# Patient Record
Sex: Male | Born: 1935 | Race: Black or African American | Hispanic: No | Marital: Married | Smoking: Former smoker
Health system: Southern US, Community
[De-identification: ages and names within clinical notes are randomized; demographics above are authoritative.]

## PROBLEM LIST (undated history)

## (undated) DIAGNOSIS — IMO0001 Reserved for inherently not codable concepts without codable children: Secondary | ICD-10-CM

## (undated) DIAGNOSIS — C61 Malignant neoplasm of prostate: Secondary | ICD-10-CM

## (undated) DIAGNOSIS — R002 Palpitations: Secondary | ICD-10-CM

## (undated) DIAGNOSIS — R55 Syncope and collapse: Secondary | ICD-10-CM

## (undated) DIAGNOSIS — I5189 Other ill-defined heart diseases: Secondary | ICD-10-CM

## (undated) DIAGNOSIS — J45909 Unspecified asthma, uncomplicated: Secondary | ICD-10-CM

## (undated) DIAGNOSIS — K118 Other diseases of salivary glands: Secondary | ICD-10-CM

## (undated) DIAGNOSIS — I779 Disorder of arteries and arterioles, unspecified: Secondary | ICD-10-CM

## (undated) DIAGNOSIS — R569 Unspecified convulsions: Secondary | ICD-10-CM

## (undated) DIAGNOSIS — I4510 Unspecified right bundle-branch block: Secondary | ICD-10-CM

## (undated) DIAGNOSIS — Z0389 Encounter for observation for other suspected diseases and conditions ruled out: Secondary | ICD-10-CM

## (undated) DIAGNOSIS — R42 Dizziness and giddiness: Secondary | ICD-10-CM

## (undated) DIAGNOSIS — I739 Peripheral vascular disease, unspecified: Secondary | ICD-10-CM

## (undated) DIAGNOSIS — I10 Essential (primary) hypertension: Secondary | ICD-10-CM

## (undated) HISTORY — DX: Other ill-defined heart diseases: I51.89

## (undated) HISTORY — DX: Palpitations: R00.2

## (undated) HISTORY — DX: Unspecified right bundle-branch block: I45.10

## (undated) HISTORY — PX: HERNIA REPAIR: SHX51

## (undated) HISTORY — DX: Peripheral vascular disease, unspecified: I73.9

## (undated) HISTORY — DX: Essential (primary) hypertension: I10

## (undated) HISTORY — DX: Disorder of arteries and arterioles, unspecified: I77.9

## (undated) HISTORY — DX: Other diseases of salivary glands: K11.8

## (undated) HISTORY — DX: Syncope and collapse: R55

## (undated) HISTORY — DX: Encounter for observation for other suspected diseases and conditions ruled out: Z03.89

## (undated) HISTORY — DX: Reserved for inherently not codable concepts without codable children: IMO0001

---

## 2003-08-10 ENCOUNTER — Other Ambulatory Visit: Payer: Self-pay

## 2006-03-06 ENCOUNTER — Other Ambulatory Visit: Payer: Self-pay

## 2006-03-06 ENCOUNTER — Emergency Department: Payer: Self-pay | Admitting: Emergency Medicine

## 2007-08-23 ENCOUNTER — Other Ambulatory Visit: Payer: Self-pay

## 2007-08-23 ENCOUNTER — Emergency Department: Payer: Self-pay | Admitting: Emergency Medicine

## 2009-08-29 ENCOUNTER — Emergency Department: Payer: Self-pay | Admitting: Internal Medicine

## 2013-03-13 ENCOUNTER — Emergency Department: Payer: Self-pay | Admitting: Emergency Medicine

## 2013-03-13 LAB — URINALYSIS, COMPLETE
Bacteria: NONE SEEN
Blood: NEGATIVE
Glucose,UR: NEGATIVE mg/dL (ref 0–75)
Ketone: NEGATIVE
Leukocyte Esterase: NEGATIVE
Nitrite: NEGATIVE
Squamous Epithelial: 1

## 2013-03-13 LAB — ETHANOL: Ethanol: 150 mg/dL

## 2013-03-13 LAB — COMPREHENSIVE METABOLIC PANEL
Albumin: 3.7 g/dL (ref 3.4–5.0)
Alkaline Phosphatase: 93 U/L
Bilirubin,Total: 0.4 mg/dL (ref 0.2–1.0)
Co2: 24 mmol/L (ref 21–32)
Creatinine: 1.1 mg/dL (ref 0.60–1.30)
Glucose: 65 mg/dL (ref 65–99)
Osmolality: 273 (ref 275–301)
Potassium: 3.6 mmol/L (ref 3.5–5.1)
SGOT(AST): 31 U/L (ref 15–37)

## 2013-03-13 LAB — CBC
HCT: 43.2 % (ref 40.0–52.0)
MCH: 29 pg (ref 26.0–34.0)
MCHC: 32.6 g/dL (ref 32.0–36.0)
MCV: 89 fL (ref 80–100)
Platelet: 176 10*3/uL (ref 150–440)
RDW: 14.9 % — ABNORMAL HIGH (ref 11.5–14.5)
WBC: 10.5 10*3/uL (ref 3.8–10.6)

## 2013-03-13 LAB — TSH: Thyroid Stimulating Horm: 2.53 u[IU]/mL

## 2013-03-14 LAB — TROPONIN I: Troponin-I: 0.04 ng/mL

## 2014-08-26 ENCOUNTER — Encounter (HOSPITAL_COMMUNITY): Payer: Self-pay

## 2014-08-26 ENCOUNTER — Other Ambulatory Visit: Payer: Self-pay | Admitting: Physician Assistant

## 2014-08-26 ENCOUNTER — Emergency Department (HOSPITAL_COMMUNITY): Payer: Medicare Other

## 2014-08-26 ENCOUNTER — Emergency Department (HOSPITAL_COMMUNITY)
Admission: EM | Admit: 2014-08-26 | Discharge: 2014-08-26 | Disposition: A | Discharge status: Home / Self Care | Payer: Medicare Other | Attending: Emergency Medicine | Admitting: Emergency Medicine

## 2014-08-26 DIAGNOSIS — I10 Essential (primary) hypertension: Secondary | ICD-10-CM | POA: Insufficient documentation

## 2014-08-26 DIAGNOSIS — R0789 Other chest pain: Principal | ICD-10-CM

## 2014-08-26 DIAGNOSIS — R001 Bradycardia, unspecified: Secondary | ICD-10-CM | POA: Diagnosis not present

## 2014-08-26 DIAGNOSIS — R42 Dizziness and giddiness: Secondary | ICD-10-CM | POA: Insufficient documentation

## 2014-08-26 DIAGNOSIS — R002 Palpitations: Secondary | ICD-10-CM

## 2014-08-26 DIAGNOSIS — R0602 Shortness of breath: Secondary | ICD-10-CM | POA: Insufficient documentation

## 2014-08-26 DIAGNOSIS — I509 Heart failure, unspecified: Secondary | ICD-10-CM | POA: Diagnosis not present

## 2014-08-26 DIAGNOSIS — Z79899 Other long term (current) drug therapy: Secondary | ICD-10-CM | POA: Insufficient documentation

## 2014-08-26 DIAGNOSIS — Z8546 Personal history of malignant neoplasm of prostate: Secondary | ICD-10-CM | POA: Diagnosis not present

## 2014-08-26 DIAGNOSIS — R079 Chest pain, unspecified: Secondary | ICD-10-CM | POA: Diagnosis present

## 2014-08-26 DIAGNOSIS — R5383 Other fatigue: Secondary | ICD-10-CM | POA: Diagnosis present

## 2014-08-26 DIAGNOSIS — R05 Cough: Secondary | ICD-10-CM | POA: Insufficient documentation

## 2014-08-26 DIAGNOSIS — Z87891 Personal history of nicotine dependence: Secondary | ICD-10-CM | POA: Insufficient documentation

## 2014-08-26 HISTORY — DX: Dizziness and giddiness: R42

## 2014-08-26 HISTORY — DX: Malignant neoplasm of prostate: C61

## 2014-08-26 LAB — COMPREHENSIVE METABOLIC PANEL
ALBUMIN: 3.8 g/dL (ref 3.5–5.0)
ALK PHOS: 73 U/L (ref 38–126)
ALT: 12 U/L — ABNORMAL LOW (ref 17–63)
ANION GAP: 11 (ref 5–15)
AST: 19 U/L (ref 15–41)
BUN: 16 mg/dL (ref 6–20)
CO2: 24 mmol/L (ref 22–32)
CREATININE: 1.2 mg/dL (ref 0.61–1.24)
Calcium: 9.6 mg/dL (ref 8.9–10.3)
Chloride: 104 mmol/L (ref 101–111)
GFR calc Af Amer: 60 mL/min — ABNORMAL LOW (ref >60)
GFR, EST NON AFRICAN AMERICAN: 52 mL/min — AB (ref >60)
GLUCOSE: 109 mg/dL — AB (ref 65–99)
Potassium: 3.8 mmol/L (ref 3.5–5.1)
Sodium: 139 mmol/L (ref 135–145)
Total Bilirubin: 0.7 mg/dL (ref 0.3–1.2)
Total Protein: 7.3 g/dL (ref 6.5–8.1)

## 2014-08-26 LAB — CBC WITH DIFFERENTIAL/PLATELET
Basophils Absolute: 0 10*3/uL (ref 0.0–0.1)
Basophils Relative: 1 % (ref 0–1)
Eosinophils Absolute: 0.1 10*3/uL (ref 0.0–0.7)
Eosinophils Relative: 2 % (ref 0–5)
HCT: 41.3 % (ref 39.0–52.0)
HEMOGLOBIN: 13.4 g/dL (ref 13.0–17.0)
LYMPHS ABS: 1.6 10*3/uL (ref 0.7–4.0)
LYMPHS PCT: 29 % (ref 12–46)
MCH: 27.8 pg (ref 26.0–34.0)
MCHC: 32.4 g/dL (ref 30.0–36.0)
MCV: 85.7 fL (ref 78.0–100.0)
MONOS PCT: 9 % (ref 3–12)
Monocytes Absolute: 0.5 10*3/uL (ref 0.1–1.0)
NEUTROS PCT: 59 % (ref 43–77)
Neutro Abs: 3.3 10*3/uL (ref 1.7–7.7)
Platelets: 198 10*3/uL (ref 150–400)
RBC: 4.82 MIL/uL (ref 4.22–5.81)
RDW: 14.6 % (ref 11.5–15.5)
WBC: 5.6 10*3/uL (ref 4.0–10.5)

## 2014-08-26 LAB — TROPONIN I
Troponin I: <0.03 ng/mL (ref <0.031)
Troponin I: <0.03 ng/mL (ref <0.031)

## 2014-08-26 LAB — I-STAT TROPONIN, ED: TROPONIN I, POC: 0.01 ng/mL (ref 0.00–0.08)

## 2014-08-26 LAB — BRAIN NATRIURETIC PEPTIDE: B Natriuretic Peptide: 56 pg/mL (ref 0.0–100.0)

## 2014-08-26 MED ORDER — LOSARTAN POTASSIUM 25 MG PO TABS: 25.0000 mg | ORAL_TABLET | Freq: Every day | ORAL | Status: DC

## 2014-08-26 MED ORDER — ASPIRIN 81 MG PO CHEW
324.0000 mg | CHEWABLE_TABLET | Freq: Once | ORAL | Status: AC
  Administered 2014-08-26: 324 mg via ORAL
  Filled 2014-08-26: qty 4

## 2014-08-26 NOTE — ED Notes (Signed)
Pt states he has been having chest pain for over a month off and on. Always has a cough but has been more steady lately. Feels tight across his chest. Reports a little SOB and dizziness.

## 2014-08-26 NOTE — Discharge Instructions (Signed)
Please call your doctor for a followup appointment within 24-48 hours. When you talk to your doctor please let them know that you were seen in the emergency department and have them acquire all of your records so that they can discuss the findings with you and formulate a treatment plan to fully care for your new and ongoing problems. Please follow-up with cardiology Please discontinue verapamil as recommended from cardiology Please start new medication that cardiology recommended Please rest and stay hydrated Please continue to monitor symptoms closely and if symptoms are to worsen or change (fever greater than 101, chills, sweating, nausea, vomiting, chest pain, shortness of breathe, difficulty breathing, weakness, numbness, tingling, worsening or changes to pain pattern, dizziness, blurred vision, sudden loss of vision, headache, dizziness, arm pain, squeezing sensation, difficulty breathing with walking, leg swelling, cough, coughing up blood) please report back to the Emergency Department immediately.   Palpitations A palpitation is the feeling that your heartbeat is irregular or is faster than normal. It may feel like your heart is fluttering or skipping a beat. Palpitations are usually not a serious problem. However, in some cases, you may need further medical evaluation. CAUSES  Palpitations can be caused by:  Smoking.  Caffeine or other stimulants, such as diet pills or energy drinks.  Alcohol.  Stress and anxiety.  Strenuous physical activity.  Fatigue.  Certain medicines.  Heart disease, especially if you have a history of irregular heart rhythms (arrhythmias), such as atrial fibrillation, atrial flutter, or supraventricular tachycardia.  An improperly working pacemaker or defibrillator. DIAGNOSIS  To find the cause of your palpitations, your health care provider will take your medical history and perform a physical exam. Your health care provider may also have you take a  test called an ambulatory electrocardiogram (ECG). An ECG records your heartbeat patterns over a 24-hour period. You may also have other tests, such as:  Transthoracic echocardiogram (TTE). During echocardiography, sound waves are used to evaluate how blood flows through your heart.  Transesophageal echocardiogram (TEE).  Cardiac monitoring. This allows your health care provider to monitor your heart rate and rhythm in real time.  Holter monitor. This is a portable device that records your heartbeat and can help diagnose heart arrhythmias. It allows your health care provider to track your heart activity for several days, if needed.  Stress tests by exercise or by giving medicine that makes the heart beat faster. TREATMENT  Treatment of palpitations depends on the cause of your symptoms and can vary greatly. Most cases of palpitations do not require any treatment other than time, relaxation, and monitoring your symptoms. Other causes, such as atrial fibrillation, atrial flutter, or supraventricular tachycardia, usually require further treatment. HOME CARE INSTRUCTIONS   Avoid:  Caffeinated coffee, tea, soft drinks, diet pills, and energy drinks.  Chocolate.  Alcohol.  Stop smoking if you smoke.  Reduce your stress and anxiety. Things that can help you relax include:  A method of controlling things in your body, such as your heartbeats, with your mind (biofeedback).  Yoga.  Meditation.  Physical activity such as swimming, jogging, or walking.  Get plenty of rest and sleep. SEEK MEDICAL CARE IF:   You continue to have a fast or irregular heartbeat beyond 24 hours.  Your palpitations occur more often. SEEK IMMEDIATE MEDICAL CARE IF:  You have chest pain or shortness of breath.  You have a severe headache.  You feel dizzy or you faint. MAKE SURE YOU:  Understand these instructions.  Will watch your  condition.  Will get help right away if you are not doing well or get  worse. Document Released: 03/11/2000 Document Revised: 03/19/2013 Document Reviewed: 05/13/2011 [facility] Patient Information 2015 [place], [place]. This information is not intended to replace advice given to you by your health care provider. Make sure you discuss any questions you have with your health care provider.   Emergency Department Resource Guide 1) Find a Doctor and Pay Out of Pocket Although you won't have to find out who is covered by your insurance plan, it is a good idea to ask around and get recommendations. You will then need to call the office and see if the doctor you have chosen will accept you as a new patient and what types of options they offer for patients who are self-pay. Some doctors offer discounts or will set up payment plans for their patients who do not have insurance, but you will need to ask so you aren't surprised when you get to your appointment.  2) Contact Your Local Health Department Not all health departments have doctors that can see patients for sick visits, but many do, so it is worth a call to see if yours does. If you don't know where your local health department is, you can check in your phone book. The CDC also has a tool to help you locate your state's health department, and many state websites also have listings of all of their local health departments.  3) Find a [facility] If your illness is not likely to be very severe or complicated, you may want to try a walk in clinic. These are popping up all over the country in pharmacies, drugstores, and shopping centers. They're usually staffed by nurse practitioners or physician assistants that have been trained to treat common illnesses and complaints. They're usually fairly quick and inexpensive. However, if you have serious medical issues or chronic medical problems, these are probably not your best option.  No Primary Care Doctor: - Call Health Connect at  [phone] - they can help you locate a primary  care doctor that  accepts your insurance, provides certain services, etc. - Physician Referral Service- [phone]  Chronic Pain Problems: Organization         Address  Phone   Notes  [facility]  [phone] Patients need to be referred by their primary care doctor.   Medication Assistance: Organization         Address  Phone   Notes  [facility] Medication [facility] [address] [place], [place] [address] [phone] --Must be a resident of [facility] -- Must have NO insurance coverage whatsoever (no Medicaid/ Medicare, etc.) -- The pt. MUST have a primary care doctor that directs their care regularly and follows them in the community   MedAssist  [phone]   [employer]  [phone]    Agencies that provide inexpensive medical care: Organization         Address  Phone   Notes  [place]  [phone]   [facility]    [phone]   [facility] [place], [place] [address] [phone]   [place] [address], [place] [phone]   Planned Parenthood    [phone]   [facility]    [phone]   [place] and [place], [employer] Phone:  [phone], Fax:  [phone] Hours of Operation:  9 am - 6 pm, M-F.  Also accepts Medicaid/Medicare and self-pay.  [facility] for [place], [address], [place] Phone: [phone], Fax: [phone]. Hours of Operation:  8:30 am - 5:30 pm, M-F.  Also accepts Medicaid and self-pay.  [facility] [place] [address] [place] Phone: [phone]   [place], [place], [place] [phone], Ext. [phone] Mondays & Thursdays: 7-9 AM.  First 15 patients are seen on a first come, first serve basis.    [place]  Providers:  Organization         Address  Phone   Notes  [facility] [address], [place] [phone] Also accepts self-pay patients.  [facility] [address], [place]  [phone]   [place], [address], [place] [phone]   [facility] [address], [place] [phone]   Lucianne Lei [address], [place]   [phone] Only accepts [place] Access [place] patients after they have their name applied to their card.   Self-Pay (no insurance) in [facility]:  Organization         Address  Phone   Notes  Sickle Cell Patients, [facility] [place] [phone]   [facility] [place] [phone]   [facility] [place]  [place], [place], [place] [phone]   [facility]/Dr. Osei-Bonsu  [address] [place] or [address], [place] [phone] Phone number for both [place] and [place] locations is the same.  [facility] [address], [place] [phone]   [facility] [address] [place] or [address] [phone]   [facility] [address] [place] [phone], phone; [phone], fax Sees patients 1st and 3rd Saturday of every month.  Must not qualify for public or private insurance (i.e. Medicaid, Medicare, [place] Health Choice, Veterans' Benefits)  Household income should be no more than 200% of the poverty level The clinic cannot treat you if you are pregnant or think you are pregnant  Sexually transmitted diseases are not treated at the clinic.    Dental Care: Organization         Address  Phone  Notes  [facility] Department of [facility] [place] [phone] Accepts children up to age 2 who are enrolled in [place] or [place]; pregnant women with a Medicaid card; and children who have applied for Medicaid or [place] Health Choice, but were declined, whose parents can pay a reduced fee at time of service.  [facility] Department of [facility]  [address], [place] [phone] Accepts children up to age 72 who are enrolled in [place] or [place]; pregnant women with a Medicaid card; and children who have applied for Medicaid or [place] Health Choice, but were declined, whose parents can pay a reduced fee at time of service.  [facility] Adult Dental Access PROGRAM  [place] [phone] Patients are seen by appointment only. Walk-ins  are not accepted. [place] will see patients 39 years of age and older. Monday - Tuesday (8am-5pm) Most Wednesdays (8:30-5pm) $30 per visit, cash only  [facility] Adult Dental Access PROGRAM  [address], [facility] [phone] Patients are seen by appointment only. Walk-ins are not accepted. [place] [facility] will see patients 56 years of age and older. One Wednesday Evening (Monthly: Volunteer Based).  $30 per visit, cash only  [place]  [phone] for adults; Children under age 3, call Graduate Pediatric Dentistry at [phone]. Children aged 85-14, please call [phone] to request a pediatric application.  Dental services are provided in all areas of dental care including fillings, crowns and bridges, complete and partial dentures, implants, gum treatment, root canals, and extractions. Preventive care is also provided. Treatment is provided to both adults and children. Patients are selected via a lottery and there is often a waiting list.   [facility] [address], [place]  [phone] [email or web address]   [facility]  [address] [place], [place] [phone], Ext. [phone] Second and Fourth Thursday of each month, opens at 6:30 AM; Clinic ends at 9 AM.  Patients are seen on a first-come first-served basis, and a limited number are seen during each clinic.   [facility]  [address] Hillard Danker [place], [place] [phone]   Eligibility Requirements You must have lived in [place], [place], or [place] counties for at least the last three months.   You cannot be eligible for state or federal sponsored [employer], including [employer], [place], or [employer].   You generally cannot be eligible for healthcare insurance through your employer.    How to apply: Eligibility screenings are held every Tuesday and Wednesday afternoon from 1:00 pm until 4:00 pm. You do not need an appointment for the interview!  [facility] [address] [place], [place]   [place]  [place] Department  [place]  [phone]    Behavioral Health Resources in the Community: Intensive Outpatient Programs Organization         Address  Phone  Notes  [place]. [address] [place], [place] [phone]   [facility] Outpatient [address] [place], [place]   ADS: Alcohol & Drug Svcs [address], [place], [place]   [place] [address],  [place], [place] or [phone]   Substance Abuse Resources Organization         Address  Phone  Notes  Alcohol and Drug Services  [phone]   [place]  [phone]   [facility]   Chinita Pester  [phone]   Residential & Outpatient Substance Abuse Program  [phone]   Psychological Services Organization         Address  Phone  Notes  [facility] [place]  [facility]  [phone]   [place] [address] [place] [phone] or [phone]    Mobile Crisis Teams Organization         Address  Phone  Notes  [facility], Mobile Crisis Care Unit  [phone]   [facility]ive Psychotherapeutic Services  [address] [place], [place]   [facility] [address], [place] [phone]  Self-Help/Support Groups Organization         Address  Phone             Notes  [facility]ental Health [facility]oc. of [place] - variety of support groups  [place] Call for more information  Narcotics Anonymous (NA), [facility] [address], [employer] [place]  2 meetings at this location   Special educational needs teacher         Address  Phone  Notes  ASAP Residential Treatment [place],    [place]  [phone]   [facility]  [address], [place] [phone], [place], [place]   [place], [place] [phone] Admissions: 8am-3pm M-F  Incentives Substance [place] [address]    [place], [place] [phone]   [facility] [address] [place], [place], [place]   [facility] [address]  [place], [place]   [facility] Programs - Intensive Outpatient [address] Kristeen [address], [place], [place]   [facility] ([place] [place].,  [place], [place] [phone] or [phone]   [facility] ([facility]) [address], [place], [place] Accepts Medicaid  [facility] [address],  [place] [phone] Substance Abuse/Addiction Treatment   [facility] Organization         Address  Phone  Notes  [facility]  [phone]   Domenic Schwab, PhD [address] Arlis Porta [place], [place]   [phone] or [phone]    [place], [place] [phone]   [facility] [address], [place], [place] [phone] Insurance/Medicaid/sponsorship through [facility] Families [address], [address] [place]                                    [place], [place] [phone] [place] [address] [place] [address], [place] [phone]    Dr. Adele Schilder  [phone]   [facility] Dept. 1) [address] [place] 2) [place] 3)  [place] [address], [place] [phone]  [phone]   [place] [phone] or [phone] (After Hours)

## 2014-08-26 NOTE — Progress Notes (Signed)
ED CM spoke with patient regarding any difficulty having prescription fillled he denies any difficulties filling prescriptions. No further ED CM needs identified.

## 2014-08-26 NOTE — ED Notes (Signed)
Pt is in stable condition upon d/c and ambulates from ED. 

## 2014-08-26 NOTE — ED Notes (Signed)
Walked pt to bathroom and back to room.

## 2014-08-26 NOTE — ED Provider Notes (Signed)
CSN: [id number]     Arrival date & time 08/26/14  1007 History   First MD Initiated Contact with Patient 08/26/14 1013     Chief Complaint  Patient presents with  . Chest Pain     (Consider location/radiation/quality/duration/timing/severity/associated sxs/prior Treatment) The history is provided by the patient. No language interpreter was used.  Paul Schmitt is an 81 y/o M with PMHx of HTN, CHF, vertigo, prostate cancer presenting to the ED with palpitations that has been ongoing for the past month. Patient reported that he has been having intermittent sensations of fast and slow heart rate. Reported that when his heart rate increases he feels light-headed and experiences palpitations. Reported that he has been having increased shortness of breath with activity. Patient stated that these symptoms have gotten progressively worse over the past week. Reported that he does hold a dry cough. Denied diaphoresis, blurred vision, sudden loss of vision, dizziness, surgery, hemoptysis, fainting, leg swelling, travels, abdominal pain, jaw pain, arm pain or weakness. PCP Dr. Roosvelt Maser   Past Medical History  Diagnosis Date  . Hypertension   . Vertigo   . CHF (congestive heart failure)   . Prostate cancer    Past Surgical History  Procedure Laterality Date  . Hernia repair     History reviewed. No pertinent family history. History  Substance Use Topics  . Smoking status: Former Research scientist (life sciences)  . Smokeless tobacco: Not on file  . Alcohol Use: No    Review of Systems  Constitutional: Positive for fatigue. Negative for fever and chills.  Eyes: Negative for visual disturbance.  Respiratory: Positive for cough and shortness of breath. Negative for chest tightness.   Cardiovascular: Positive for palpitations. Negative for chest pain and leg swelling.  Neurological: Positive for light-headedness. Negative for dizziness and weakness.      Allergies  Review of patient's allergies indicates no known  allergies.  Home Medications   Prior to Admission medications   Medication Sig Start Date End Date Taking? Authorizing Provider  amLODipine (NORVASC) 10 MG tablet Take 10 mg by mouth daily. 07/22/14  Yes Historical Provider, MD  levothyroxine (SYNTHROID, LEVOTHROID) 25 MCG tablet Take 25 mcg by mouth daily. 06/10/14  Yes Historical Provider, MD  losartan (COZAAR) 25 MG tablet Take 1 tablet (25 mg total) by mouth daily. 08/26/14   Bhavinkumar Bhagat, PA   BP 183/67 mmHg  Pulse 59  Temp(Src) 97.9 F (36.6 C) (Oral)  Resp 18  SpO2 100% Physical Exam  Constitutional: He is oriented to person, place, and time. He appears well-developed and well-nourished. No distress.  HENT:  Head: Normocephalic and atraumatic.  Eyes: Conjunctivae and EOM are normal. Pupils are equal, round, and reactive to light. Right eye exhibits no discharge. Left eye exhibits no discharge.  Neck: Normal range of motion. Neck supple.  Cardiovascular: Normal rate, regular rhythm and normal heart sounds.   Pulses:      Radial pulses are 2+ on the right side, and 2+ on the left side.       Dorsalis pedis pulses are 2+ on the right side, and 2+ on the left side.  Cap refill < 3 seconds Negative swelling or pitting edema noted to the lower extremities bilaterally   Pulmonary/Chest: Effort normal and breath sounds normal. No respiratory distress. He has no wheezes. He has no rales.  Patient is able to speak in full sentences without difficulty  Negative use of accessory muscles Negative stridor  Musculoskeletal: Normal range of motion.  Neurological: He  is alert and oriented to person, place, and time. No cranial nerve deficit. He exhibits normal muscle tone. Coordination normal. GCS eye subscore is 4. GCS verbal subscore is 5. GCS motor subscore is 6.  Skin: Skin is warm and dry. No rash noted. He is not diaphoretic. No erythema.  Psychiatric: He has a normal mood and affect. His behavior is normal. Thought content normal.   Nursing note and vitals reviewed.   ED Course  Procedures (including critical care time)  Results for orders placed or performed during the hospital encounter of 08/26/14  CBC with Differential/Platelet  Result Value Ref Range   WBC 5.6 4.0 - 10.5 K/uL   RBC 4.82 4.22 - 5.81 MIL/uL   Hemoglobin 13.4 13.0 - 17.0 g/dL   HCT 41.3 39.0 - 52.0 %   MCV 85.7 78.0 - 100.0 fL   MCH 27.8 26.0 - 34.0 pg   MCHC 32.4 30.0 - 36.0 g/dL   RDW 14.6 11.5 - 15.5 %   Platelets 198 150 - 400 K/uL   Neutrophils Relative % 59 43 - 77 %   Neutro Abs 3.3 1.7 - 7.7 K/uL   Lymphocytes Relative 29 12 - 46 %   Lymphs Abs 1.6 0.7 - 4.0 K/uL   Monocytes Relative 9 3 - 12 %   Monocytes Absolute 0.5 0.1 - 1.0 K/uL   Eosinophils Relative 2 0 - 5 %   Eosinophils Absolute 0.1 0.0 - 0.7 K/uL   Basophils Relative 1 0 - 1 %   Basophils Absolute 0.0 0.0 - 0.1 K/uL  Comprehensive metabolic panel  Result Value Ref Range   Sodium 139 135 - 145 mmol/L   Potassium 3.8 3.5 - 5.1 mmol/L   Chloride 104 101 - 111 mmol/L   CO2 24 22 - 32 mmol/L   Glucose, Bld 109 (H) 65 - 99 mg/dL   BUN 16 6 - 20 mg/dL   Creatinine, Ser 1.20 0.61 - 1.24 mg/dL   Calcium 9.6 8.9 - 10.3 mg/dL   Total Protein 7.3 6.5 - 8.1 g/dL   Albumin 3.8 3.5 - 5.0 g/dL   AST 19 15 - 41 U/L   ALT 12 (L) 17 - 63 U/L   Alkaline Phosphatase 73 38 - 126 U/L   Total Bilirubin 0.7 0.3 - 1.2 mg/dL   GFR calc non Af Amer 52 (L) >60 mL/min   GFR calc Af Amer 60 (L) >60 mL/min   Anion gap 11 5 - 15  Troponin I  Result Value Ref Range   Troponin I <0.03 <0.031 ng/mL  Brain natriuretic peptide  Result Value Ref Range   B Natriuretic Peptide 56.0 0.0 - 100.0 pg/mL  Troponin I  Result Value Ref Range   Troponin I <0.03 <0.031 ng/mL  I-stat troponin, ED  (not at [facility], [facility])  Result Value Ref Range   Troponin i, poc 0.01 0.00 - 0.08 ng/mL   Comment 3            Labs Review Labs Reviewed  COMPREHENSIVE METABOLIC PANEL - Abnormal; Notable for the  following:    Glucose, Bld 109 (*)    ALT 12 (*)    GFR calc non Af Amer 52 (*)    GFR calc Af Amer 60 (*)    All other components within normal limits  CBC WITH DIFFERENTIAL/PLATELET  TROPONIN I  BRAIN NATRIURETIC PEPTIDE  TROPONIN I  Randolm Idol, ED    Imaging Review Dg Chest 2 View  08/26/2014  CLINICAL DATA:  Chest pain and cough for a few days, history of arrhythmia, prostate cancer, CHF, hypertension, former smoker  EXAM: CHEST  2 VIEW  COMPARISON:  None  FINDINGS: Normal heart size, mediastinal contours, and pulmonary vascularity.  Atherosclerotic calcification aorta.  Bronchitic changes without acute infiltrate, pleural effusion or pneumothorax.  Bones demineralized with scattered degenerative disc disease changes thoracic spine.  BILATERAL glenohumeral degenerative changes.  IMPRESSION: Bronchitic changes without acute infiltrate.   Electronically Signed   By: Lavonia Dana M.D.   On: 08/26/2014 11:11     EKG Interpretation   Date/Time:  Tuesday Aug 26 2014 10:13:49 EDT Ventricular Rate:  76 PR Interval:  160 QRS Duration: 132 QT Interval:  390 QTC Calculation: 438 R Axis:   99 Text Interpretation:  Normal sinus rhythm Right bundle branch block  Abnormal ECG Confirmed by Jeneen Rinks  MD, [place] ([id number]) on 08/26/2014 10:25:38 AM       12:22 PM Spoke with Wannetta Sender, cardiologist. Discussed case in great detail. Cardiology to come and assess patient.  2:57 PM This provider spoke with Bhagat, PA-C. Patient seen and assessed by Cardiology. Reported that patient can go home after Troponin repeated at 4:00PM and if negative. Reported that patient will get an appointment to be seen as an outpatient and will most likely get 2 week cardiac monitor. This provider reviewed cardiology's note. As per cardiology reported that if troponin is negative at 4:00 PM can be discharged home. Recommended verapamil to be discontinued and for patient to be started on losartan 25 mg daily.   4:04 PM  Patient seen and assessed by attending physician, agreed to plan of discharge and Cardiology consult.   6:16 PM This provider re-assessed the patient. Patient reported that he is feeling better. Denied chest pain, shortness of breath, difficulty breathing, palpitations. Patient reports that he's been feeling just fine while in ED setting.  MDM   Final diagnoses:  Palpitations    Medications  aspirin chewable tablet 324 mg (324 mg Oral Given 08/26/14 1201)    Filed Vitals:   08/26/14 1600 08/26/14 1625 08/26/14 1700 08/26/14 1822  BP: 172/92 172/92 168/77 183/67  Pulse: 60 58 56 59  Temp:      TempSrc:      Resp: '12 18 13 18  '$ SpO2: 100% 100% 99% 100%   EKG noted normal sinus rhythm with a right bundle branch block. Troponin negative elevation. BNP negative elevation. CBC negative white blood cell count. Hemoglobin 13.4, hematocrit 41.3. CMP unremarkable. Chest x-ray noted bronchitis changes without acute infiltrate. Patient seen and assessed by cardiology in ED setting, reported that if second troponin at 4:00 PM is negative patient can be discharged home. Stated the patient is asymptomatic. Reported that patient will get an appointment as an outpatient with cardiac monitor. Second troponin negative elevation. Patient reported that he is feeling much better, denied any complaints. Stated that he has not experienced any chest pain, shortness of breath, difficulty breathing, palpitations or any other symptoms while in ED setting. Patient has been seen and assessed in ED setting by cardiology who recommended patient to be discharged home-recommended patient to have verapamil discontinued and for patient to be started on losartan-cardiology PA has ordered a written prescription for losartan for patient. Patient stable, afebrile. Patient not septic appearing. Negative signs of respiratory distress. Discharged patient. Discussed with patient to rest and stay hydrated. Referred patient to PCP and  cardiology. Discussed with patient to closely monitor symptoms and if symptoms are to  worsen or change to report back to the ED - strict return instructions given.  Patient agreed to plan of care, understood, all questions answered.   Jamse Mead, PA-C 08/26/14 1957  Tanna Furry, MD 09/03/14 [phone]

## 2014-08-26 NOTE — Consult Note (Signed)
CARDIOLOGY CONSULT NOTE   Patient ID: Paul Schmitt MRN: [id number] DOB/AGE: 79-Jul-1924 79 y.o.  Admit date: 08/26/2014  Primary Physician   No primary care provider on file. Dr. Roosvelt Maser, [facility] internal medicine. Phone [phone] ext [phone].  Primary Cardiologist   New (Dr. Ellyn Hack) Reason for Consultation   Palpitation and SOB  HPI: Paul Schmitt is a 81 y.o. male with a history of HTN, dyslipidemia, RBBB, vertigo, prostate cancer, former smoker, OSA, hypothyroidism, and migraine presents to the ED 5/31 for evaluation of chest tightness, SOB and  palpitations that has been ongoing for the past month.   He f/u with Dr. Roosvelt Maser. He was doing well on cardiac stand point of view when seen last by Dr. Roosvelt Maser at 06/04/14. He had normal nuclear study in 2007. Hx of "missed beat" however unable to provide detailed information. Denies previous history of cath or echocardiogram. Previous EKG reading on Care Everywhere showed sinus bradycardia with RBBB.  Patient reported that he has been having intermittent sensations of fast and slow heart rate. He reported that when his heart rate increases he feels light-headed and experiences palpitations. Noticed increased shortness of breath with activity. Patient stated that these symptoms have gotten progressively worse over the past week. He also having intermittent chest tightness/pressure without associated symptoms that resolved by itself in few minutes. He denied orthopnea, PND, le swelling, diaphoresis, syncope, blurred vision, sudden loss of vision, dizziness, surgery, hemoptysis,  travels, abdominal pain.   In ED CXR showed Bronchitic changes without acute infiltrate. EKG Normal sinus rhythm with rate of 75, RBBB, No acute ST or T wave abnormality. Lytes normal. BNP normal. Top x 1 negative.    Past Medical History  Diagnosis Date  . Hypertension   . Vertigo   . CHF (congestive heart failure)   . Prostate cancer      Past Surgical History    Procedure Laterality Date  . Hernia repair      No Known Allergies  I have reviewed the patient's current medications  Prior to Admission medications   Medication Sig Start Date End Date Taking? Authorizing Provider  amLODipine (NORVASC) 10 MG tablet Take 10 mg by mouth daily. 07/22/14  Yes Historical Provider, MD  levothyroxine (SYNTHROID, LEVOTHROID) 25 MCG tablet Take 25 mcg by mouth daily. 06/10/14  Yes Historical Provider, MD  verapamil (VERELAN PM) 120 MG 24 hr capsule Take 120 mg by mouth daily. 08/19/14  Yes Historical Provider, MD     History   Social History  . Marital Status: Married    Spouse Name: N/A  . Number of Children: N/A  . Years of Education: N/A   Occupational History  . Not on file.   Social History Main Topics  . Smoking status: Former Research scientist (life sciences)  . Smokeless tobacco: Not on file  . Alcohol Use: No  . Drug Use: No  . Sexual Activity: Not on file   Other Topics Concern  . Not on file   Social History Narrative  . No narrative on file    No family status information on file.   History reviewed. No pertinent family history.   ROS:  Full 14 point review of systems complete and found to be negative unless listed above.  Physical Exam: Blood pressure 157/74, pulse 53, temperature 97.9 F (36.6 C), temperature source Oral, resp. rate 14, SpO2 100 %.  General: Well developed, well nourished, male in no acute distress Head: Eyes PERRLA, No xanthomas. Normocephalic and atraumatic, oropharynx  without edema or exudate.  Lungs: Resp regular and unlabored, CTA. Heart: RRR no s3, s4, or murmurs..   Neck: No carotid bruits. No lymphadenopathy.  JVD. Abdomen: Bowel sounds present, abdomen soft and non-tender without masses or hernias noted. Msk:  No spine or cva tenderness. No weakness, no joint deformities or effusions. Extremities: No clubbing, cyanosis or edema. DP/PT/Radials 2+ and equal bilaterally. Neuro: Alert and oriented X 3. No focal deficits  noted. Psych:  Good affect, responds appropriately Skin: No rashes or lesions noted.  Labs:   Lab Results  Component Value Date   WBC 5.6 08/26/2014   HGB 13.4 08/26/2014   HCT 41.3 08/26/2014   MCV 85.7 08/26/2014   PLT 198 08/26/2014   No results for input(s): INR in the last 72 hours.  Recent Labs Lab 08/26/14 1038  NA 139  K 3.8  CL 104  CO2 24  BUN 16  CREATININE 1.20  CALCIUM 9.6  PROT 7.3  BILITOT 0.7  ALKPHOS 73  ALT 12*  AST 19  GLUCOSE 109*  ALBUMIN 3.8    Recent Labs  08/26/14 1038  TROPONINI <0.03    Recent Labs  08/26/14 1148  TROPIPOC 0.01     ECG:  Normal sinus rhythm with rate of 75, RBBB, No acute ST or T wave abnormality  Radiology:  Dg Chest 2 View  08/26/2014   CLINICAL DATA:  Chest pain and cough for a few days, history of arrhythmia, prostate cancer, CHF, hypertension, former smoker  EXAM: CHEST  2 VIEW  COMPARISON:  None  FINDINGS: Normal heart size, mediastinal contours, and pulmonary vascularity.  Atherosclerotic calcification aorta.  Bronchitic changes without acute infiltrate, pleural effusion or pneumothorax.  Bones demineralized with scattered degenerative disc disease changes thoracic spine.  BILATERAL glenohumeral degenerative changes.  IMPRESSION: Bronchitic changes without acute infiltrate.   Electronically Signed   By: Lavonia Dana M.D.   On: 08/26/2014 11:11    ASSESSMENT AND PLAN:     1. Atypical chest pain - Patient having intermittent chest tightness that resolves by itself. EKFwith rate of 75, RBBB, no acute ST or T wave abnormality. - Will check another troponin, if normal plan to f/u as outpatient cardiologist. Consider nuclear study if ongoing symptoms.  2. Palpitation - Intermittent. Pulse in rage of 50s to 60s. Will discontinue his verapamil. Plan to get echo and 2 weeks event monitor placement as outpatient.   3. HTN - Relatively stable. Discontinue verapamil. Will add losartan '25mg'$  daily. Creatinine 1.2 today.  Check renal function in 4 weeks.   4. OSA - On CPAP  Signed: Bhagat,Bhavinkumar, PA 08/26/2014, 1:32 PM Pager [phone]  Co-Sign MD   I saw and examined the patient in the emergency room along with Mr. Curly [place], [place].  We discussed the patient together and reviewed all the available data. It appears that the patient has been having intermittent episodes of tightness in his chest that is happening irrespective of any particular activity levels. The main reason why he presented was because his daughter had noticed varying heart rates from bradycardia to tachycardia and he has worsening fatigue.  Active Problems:   Intermittent palpitations   Chest tightness   Bradycardia   Fatigue - with exercise intolerance   His exam is essentially benign the exception of thumb sinus bradycardia with intermittent PVCs. I don't hear any significant gallops or murmurs.  He is feeling relatively stable and is not had any recent chest pain. I think is not unreasonable to rule  out MI with another set of cardiac enzymes later on this afternoon and then discharge him from the emergency room if negative with plans to set up cardiology clinic follow-up in the [facility] office after he wears a monitor for 2 weeks. I would also like to get a 2-D echocardiogram to assess his overall cardiac function given his fatigue and exertional dyspnea. Depending on what the echocardiogram shows and his symptoms off of verapamil, we may want to consider outpatient stress testing.  We will discontinue verapamil and add losartan.  The plans were discussed with the ER physician and his outpatient follow-up will be arranged.  Leonie Man, M.D., M.S. Interventional Cardiologist   Pager # [phone]

## 2014-08-29 ENCOUNTER — Encounter: Payer: Self-pay | Admitting: Physician Assistant

## 2014-08-29 ENCOUNTER — Ambulatory Visit (INDEPENDENT_AMBULATORY_CARE_PROVIDER_SITE_OTHER): Payer: Medicare Other | Admitting: Physician Assistant

## 2014-08-29 VITALS — BP 130/60 | HR 62 | Ht 71.0 in | Wt 176.5 lb

## 2014-08-29 DIAGNOSIS — R0789 Other chest pain: Secondary | ICD-10-CM

## 2014-08-29 DIAGNOSIS — R001 Bradycardia, unspecified: Secondary | ICD-10-CM | POA: Diagnosis not present

## 2014-08-29 DIAGNOSIS — R5383 Other fatigue: Secondary | ICD-10-CM

## 2014-08-29 DIAGNOSIS — R55 Syncope and collapse: Secondary | ICD-10-CM

## 2014-08-29 DIAGNOSIS — R002 Palpitations: Secondary | ICD-10-CM

## 2014-08-29 DIAGNOSIS — R0602 Shortness of breath: Secondary | ICD-10-CM

## 2014-08-29 DIAGNOSIS — Z87898 Personal history of other specified conditions: Secondary | ICD-10-CM

## 2014-08-29 DIAGNOSIS — R531 Weakness: Secondary | ICD-10-CM | POA: Diagnosis not present

## 2014-08-29 NOTE — Patient Instructions (Signed)
Medication Instructions:  Your physician recommends that you continue on your current medications as directed. Please refer to the Current Medication list given to you today.   Labwork: none  Testing/Procedures: You have an echo scheduled for Monday, June 6 at 4:00pm You will be contacted for further instructions regarding 14-day event monitor  Follow-Up: Your physician recommends that you schedule a follow-up appointment in 3-4 weeks with Dr. Ellyn Hack   Any Other Special Instructions Will Be Listed Below (If Applicable).

## 2014-08-29 NOTE — Progress Notes (Signed)
Cardiology Office Note:  Date of Encounter: 08/29/2014  ID: Paul Schmitt, DOB May 02, 1922, MRN [id number]  PCP:  [place] Primary Cardiologist:  Dr. Ellyn Hack, MD  Chief Complaint  Patient presents with  . other    Follow up from [facility] ER from last Tuesday with bradycardia. Meds reviewed by the patient verbally. Pt. c/o irreg. heart beats, weakness and shortness of breath.     HPI:  81 year old male with history of HTN, dyslipidemia, RBBB, BPPV, prostate cancer, former smoker, OSA, hypothyroidism, and migraines who presents for hospital follow up for recent ED visit to [facility] on 5/31 for palpitations and SOB.   He is followed by his PCP Dr. Roosvelt Maser, at [facility]. He has previously undergone cardiac cath in 1989 that showed normal coronary arteries with EF 84%. He last underwent ischemic evaluation in 2007 with an adenosine nuclear stress test that showed no evidence on nuclear images to suggest any significant ischemia or scar. Global systolic function and thickening was normal. EF was > 65%. Diaphragmatic attenuation was noted. Previous EKGs readings show NSR to sinus bradycardia with RBBB. He has reported history of "missed beats" but has previously been unable to provide further details regarding this.   He was seen by his PCP on 06/04/2014 with complaints of headache and dizziness. It was reported this was not like his previous vertigo symptoms. Patient reported not eating or drinking well at that time. He also noted bilateral shoulder pains. Labs from that visit showed: CBC unremarkable, K+ 3.9, SCr 1.04, glucose 89, A1C 5.7%, TSH 2.98, LDL 121, TC 207, HDL 59, TG not performed. He was started on Ultram and Tylenol.   He presented to [facility] on 5/31 with intermittent palpitations, lightheadedness, and self reported bradycardia taken at home by his daughter into the 37's. He was also noting SOB with exertion. Patient stated that these symptoms have gotten progressively worse over the past  week. He also having intermittent chest tightness/pressure without associated symptoms that resolved by itself in few minutes. He denied orthopnea, PND, le swelling, diaphoresis, syncope, blurred vision, sudden loss of vision, dizziness, surgery, hemoptysis, travels, abdominal pain.   In ED CXR showed bronchitic changes without acute infiltrate. EKG NSR, 75 bpm, RBBB, no acute ST or T wave abnormality. K+ 3.8, SCr 1.20. BNP normal. Troponin negative x 2. His verapamil was discontinued 2/2 heart rates in the 50's to 60's. He was advised to have an outpatient echo and 14 day event monitor. With the discontinuation of his verapamil losartan was added for treatment of his HTN.  Since discontinuing verapamil and starting losartan he reports feeling much better over the past couple of days. He has not had any issues with palpitations and has not noted any low heart rates. He has not had any further chest pain since his visit to the [facility] ED. Weight has been stable, without any cough, lower extremity edema, or orthopnea.   Of note, he does report 2 syncopal episodes years ago (not in [place]) that occurred while driving. Work up was unrevealing per patient, including outpatient cardiac monitoring.       Past Medical History  Diagnosis Date  . Hypertension   . Vertigo   . Syncope   . Prostate cancer   . Normal coronary arteries     a. cardiac cath 1989: normal coronary arteries; b. nuclear stress test: no significant ischemia or scar, EF >65%   :  Past Surgical History  Procedure Laterality Date  .  Hernia repair    :  Social History:  The patient  reports that he quit smoking about 3 months ago. His smoking use included Cigarettes. He has a 17.5 pack-year smoking history. He does not have any smokeless tobacco history on file. He reports that he does not drink alcohol or use illicit drugs.   Family History  Problem Relation Age of Onset  . Heart disease Mother   . Heart attack Mother 31    . Hyperlipidemia Mother   . Hypertension Mother   . Heart disease Sister   . Heart attack Sister 66  . Heart disease Brother   . Heart attack Brother 32     Allergies:  No Known Allergies   Home Medications:  Current Outpatient Prescriptions  Medication Sig Dispense Refill  . amLODipine (NORVASC) 10 MG tablet Take 10 mg by mouth daily.    Marland Kitchen levothyroxine (SYNTHROID, LEVOTHROID) 25 MCG tablet Take 25 mcg by mouth daily.    Marland Kitchen losartan (COZAAR) 25 MG tablet Take 1 tablet (25 mg total) by mouth daily. 30 tablet 6   No current facility-administered medications for this visit.     Review of Systems:  Review of Systems  Constitutional: Positive for malaise/fatigue. Negative for fever, chills, weight loss and diaphoresis.  HENT: Negative for hearing loss, sore throat and tinnitus.   Eyes: Negative for blurred vision, double vision, photophobia, pain, discharge and redness.  Respiratory: Positive for shortness of breath. Negative for cough, hemoptysis, sputum production and wheezing.   Cardiovascular: Positive for palpitations. Negative for chest pain, orthopnea, claudication, leg swelling and PND.  Gastrointestinal: Negative for heartburn, nausea, vomiting, abdominal pain, diarrhea, constipation, blood in stool and melena.  Genitourinary: Negative for hematuria.  Musculoskeletal: Negative for myalgias and falls.  Skin: Negative for itching and rash.  Neurological: Positive for dizziness, tremors and weakness. Negative for tingling, sensory change, speech change, focal weakness, loss of consciousness and headaches.  Psychiatric/Behavioral: Negative for depression, hallucinations, memory loss and substance abuse. The patient is not nervous/anxious.   All other systems reviewed and are negative.    Physical Exam:  Blood pressure 130/60, pulse 62, height '5\' 11"'$  (1.803 m), weight 176 lb 8 oz (80.06 kg). Body mass index is 24.63 kg/(m^2). General: Pleasant, NAD. Psych: Normal  affect. Neuro: Alert and oriented X 3. Moves all extremities spontaneously. HEENT: Normocephalic, atraumatic, EOM intact bilaterally.   Neck: Supple without bruits or JVD. Lungs:  Resp regular and unlabored, CTA. Heart: RRR no s3, s4, or murmurs. Abdomen: Soft, non-tender, non-distended, BS + x 4.  Extremities: No clubbing, cyanosis or edema. DP/PT/Radials 2+ and equal bilaterally.   Accessory Clinical Findings:  EKG - NSR, 62 bpm, RBBB, no significant st/t changes   Other studies Reviewed: Additional studies/ records that were reviewed today include: ED visit.   Recent Labs: 08/26/2014: ALT 12*; B Natriuretic Peptide 56.0; BUN 16; Creatinine 1.20; Hemoglobin 13.4; Platelets 198; Potassium 3.8; Sodium 139    Lipid Panel No results found for: CHOL, TRIG, HDL, CHOLHDL, VLDL, LDLCALC, LDLDIRECT   Weights: Wt Readings from Last 3 Encounters:  08/29/14 176 lb 8 oz (80.06 kg)     Assessment & Plan:  1. Intermittent palpitations:  -Have patient wear 14 day event monitor (has not yet received, office to check on this today) -Echo to assess his overall cardiac function given his presenting fatigue and exertional dyspnea  -Hold AV nodal blocking agent at this time given history of bradycardia, as well as while he is going  to be wearing event monitor in an effort to capture any significant arrhythmia, tachy-brady episode, or pause -He reports a history of an "irregular heart beat" his whole life   2. Chest pain:  -No further symptoms -Should he develop any further chest pain or if echo is abnormal would pursue nuclear stress testing -Could also plan for nuclear stress test if work up for SOB/fatigue is unrevealing and his symptoms persist, even with the discontinuation of verapamil   3. Bradycardia:  -Verapamil was changed to losartan in the ED on 5/31  -Heart rate in the 60's today -He denies any further episodes of bradycardia or palpitations   -14 day event monitor to be  placed   4. HTN:  -Controlled -Continue losartan  -Check bmet (SCr 1.20 in the ED on 5/31)   5. Fatigue/exertional dyspnea:  -Possible multiple factorial including exercise intolerance, OSA, and history of CCB usage -He reports significant improvement over the past couple of days since the discontinuation of his verapamil   -Labs checked by PCP on 3/9 showed unremarkable CBC, normal TSH, normal glucose lytes ok  -He has reported history of CHF when looking through the consult note but not Care Everywhere. The only documented EF that could be found was from [place] which was normal. Echo has been ordered to clear this up  -Symptoms possibly related to numbers #1 and #3   Dispo:  -Follow up post event monitor and echo   Christell Faith, PA-C [place], [place] [address] [phone] [facility] 08/29/2014, 4:36 PM

## 2014-09-01 ENCOUNTER — Ambulatory Visit (INDEPENDENT_AMBULATORY_CARE_PROVIDER_SITE_OTHER): Payer: Medicare Other

## 2014-09-01 ENCOUNTER — Other Ambulatory Visit: Payer: Self-pay

## 2014-09-01 DIAGNOSIS — R079 Chest pain, unspecified: Secondary | ICD-10-CM

## 2014-09-01 DIAGNOSIS — R0789 Other chest pain: Principal | ICD-10-CM

## 2014-09-03 ENCOUNTER — Encounter: Payer: Self-pay | Admitting: Cardiology

## 2014-09-23 ENCOUNTER — Encounter: Payer: Self-pay | Admitting: Physician Assistant

## 2014-09-23 DIAGNOSIS — I5189 Other ill-defined heart diseases: Secondary | ICD-10-CM | POA: Insufficient documentation

## 2014-09-24 ENCOUNTER — Telehealth: Payer: Self-pay | Admitting: *Deleted

## 2014-09-24 ENCOUNTER — Encounter: Payer: Self-pay | Admitting: Physician Assistant

## 2014-09-24 ENCOUNTER — Ambulatory Visit (INDEPENDENT_AMBULATORY_CARE_PROVIDER_SITE_OTHER): Payer: Medicare Other | Admitting: Physician Assistant

## 2014-09-24 VITALS — BP 138/62 | HR 73 | Ht 72.0 in | Wt 177.8 lb

## 2014-09-24 DIAGNOSIS — R42 Dizziness and giddiness: Secondary | ICD-10-CM | POA: Insufficient documentation

## 2014-09-24 DIAGNOSIS — R002 Palpitations: Secondary | ICD-10-CM

## 2014-09-24 DIAGNOSIS — R0602 Shortness of breath: Secondary | ICD-10-CM | POA: Diagnosis not present

## 2014-09-24 DIAGNOSIS — I519 Heart disease, unspecified: Secondary | ICD-10-CM

## 2014-09-24 DIAGNOSIS — R001 Bradycardia, unspecified: Secondary | ICD-10-CM | POA: Diagnosis not present

## 2014-09-24 DIAGNOSIS — R5383 Other fatigue: Secondary | ICD-10-CM

## 2014-09-24 DIAGNOSIS — I5189 Other ill-defined heart diseases: Secondary | ICD-10-CM

## 2014-09-24 MED ORDER — MECLIZINE HCL 50 MG PO TABS: 50.0000 mg | ORAL_TABLET | Freq: Three times a day (TID) | ORAL | Status: DC | PRN

## 2014-09-24 NOTE — Patient Instructions (Addendum)
Medication Instructions:  Please start Antivert 50 mg up to 3 times a day for vertigo  Labwork: None  Testing/Procedures: [facility]  Your caregiver has ordered a Stress Test with nuclear imaging. The purpose of this test is to evaluate the blood supply to your heart muscle. This procedure is referred to as a "Non-Invasive Stress Test." This is because other than having an IV started in your vein, nothing is inserted or "invades" your body. Cardiac stress tests are done to find areas of poor blood flow to the heart by determining the extent of coronary artery disease (CAD). Some patients exercise on a treadmill, which naturally increases the blood flow to your heart, while others who are  unable to walk on a treadmill due to physical limitations have a pharmacologic/chemical stress agent called Lexiscan . This medicine will mimic walking on a treadmill by temporarily increasing your coronary blood flow.   Please note: these test may take anywhere between 2-4 hours to complete  PLEASE REPORT TO [facility] AT THE FIRST DESK WILL DIRECT YOU WHERE TO GO  Date of Procedure:_____Tuesday, July 5____  Arrival Time for Procedure:____8:45 am______  Instructions regarding medication:   __X__:  Hold AMLODIPINE the night before procedure and morning of procedure  PLEASE NOTIFY THE OFFICE AT LEAST 24 HOURS IN ADVANCE IF YOU ARE UNABLE TO KEEP YOUR APPOINTMENT.  [id number]  How to prepare for your Myoview test:   Do not eat or drink after midnight  No caffeine for 24 hours prior to test  No smoking 24 hours prior to test.  Your medication may be taken with water.  If your doctor stopped a medication because of this test, do not take that medication.  Ladies, please do not wear dresses.  Skirts or pants are appropriate. Please wear a short sleeve shirt.  No perfume, cologne or lotion.  Follow-Up: 3 months   Any Other Special Instructions Will Be Listed  Below (If Applicable).   Adenosine Stress Electrocardiography An adenosine stress electrocardiography is a test used to detect heart disease (coronary artery disease). Adenosine is a medicine that makes the heart arteries react as if you are exercising. Adenosine is given with a radioactive tracer. The "tracer" is a safe radioactive substance that travels in the bloodstream to the heart arteries. Special imaging cameras detect the tracer and help find blocked arteries in the heart. This test may be done with or without treadmill exercise.  LET [facility] CARE PROVIDER KNOW ABOUT:  Allergies, including latex allergies.  All prescription medicines you taking as well as all non-prescription and over-the-counter medicines, including herbs and vitamins.  Use of steroids (by mouth or creams).  Previous problems with anesthetics or novocaine.  History of blood clots or bleeding problems.  Previous surgery.  Other health problems such as kidney or lung conditions.  Possibility of pregnancy, if this applies. RISKS AND COMPLICATIONS You may develop chest discomfort, shortness of breath, sweating, or light-headedness during the test. On rare occasions, you could experience a heart attack or your heart may go into a very fast or irregular rhythm. This could cause you to collapse. To ensure your safety, your health care provider will supervise the test. Your blood pressure and electrocardiogram are constantly watched. The test team watches for and is able to treat any problems. BEFORE THE PROCEDURE  Do not eat or drink caffeine for 12 to 24 hours before the test. This includes all caffeinated beverages and food, such as  pop, coffee (roasted, instant, decaffeinated roasted, decaffeinated instant), hot chocolate, tea, and all chocolate.  Do not smoke on the day of your test. Smoking on the day of your test may change your test results.  Do not eat anything 3 hours before the test or as recommended by  your health care provider. Eating may cause an unclear image and may also cause nausea. If you are diabetic, talk to your health care provider regarding your insulin coverage.  Bring a list of all the medicines you are taking. Take your medicine as usual before the test except as told by the testing center.  Wear comfortable clothing, such as a short-sleeve shirt and sweatpants. Do not wear an underwire bra or jewelry. A hospital gown can be provided.  Shower before your appointment to reduce the spread of bacteria.  You may want to bring a book to read because there are some waiting periods during the test.  Your health care provider will go over the adenosine stress test with you, such as procedure protocol, what to expect, how long it will take, and results. PROCEDURE   An IV will be started in a vein in your hand or arm.  Electrode patches will be placed on your chest. The electrodes are connected to a monitor so your heart rhythm and heart rate can be watched. Your blood pressure will also be monitored during the test.  Two sets of images are usually taken of your heart. The images compare your heart at rest and when it is "stressed." This first image is a "resting" picture of your heart. The "resting" image is usually done before adenosine is given.  Adenosine is given in the IV over a period of 4 to 6 minutes.  After the adenosine is given, you will be monitored for a few minutes afterward to ensure your heart rate, heart rhythm, and blood pressure are normal. AFTER THE PROCEDURE  When your test is completed, you may be asked to schedule an office visit with your health care provider to discuss the test results, or your health care provider may choose to call you with the results.  Document Released: 05/22/2006 Document Revised: 07/29/2013 Document Reviewed: 06/29/2011 [facility] Patient Information 2015 [place], [place]. This information is not intended to replace advice given to you by  your health care provider. Make sure you discuss any questions you have with your health care provider.

## 2014-09-24 NOTE — Progress Notes (Signed)
Cardiology Office Note:  Date of Encounter: 09/24/2014  ID: Paul Schmitt, DOB 08-03-1922, MRN [id number]  PCP:  [place] Primary Cardiologist:  Dr. Ellyn Hack, MD  Chief Complaint  Patient presents with  . other    3-4 week follow up. Meds reviewed by the patient verbally. Pt. c/o vertigo today.     HPI:  81 year old male with history of HTN, dyslipidemia, RBBB, BPPV, prostate cancer, former smoker, OSA, hypothyroidism, and migraines who presents for routine follow up of his intermittent palpitations.    He is followed by his PCP, Dr. Roosvelt Maser, at [facility]. He has previously undergone cardiac cath in 1989 that showed normal coronary arteries with EF 84%. He last underwent ischemic evaluation in 2007 with an adenosine nuclear stress test that showed no evidence on nuclear images to suggest any significant ischemia or scar. Global systolic function and thickening was normal. EF was > 65%. Diaphragmatic attenuation was noted. Previous EKG readings show NSR to sinus bradycardia with RBBB. He has reported history of "missed beats" but has previously been unable to provide further details regarding this.   He was seen by his PCP on 06/04/2014 with complaints of headache and dizziness. It was reported this was not like his previous vertigo symptoms. Patient reported not eating or drinking well at that time. He also noted bilateral shoulder pains. Labs from that visit showed: CBC unremarkable, K+ 3.9, SCr 1.04, glucose 89, A1C 5.7%, TSH 2.98, LDL 121, TC 207, HDL 59, TG not performed. He was started on Ultram and Tylenol.   He presented to [facility] on 5/31 with intermittent palpitations, lightheadedness, and self reported bradycardia taken at home by his daughter into the 10's. He was also noting SOB with exertion. He was also noting intermittent chest tightness/pressure without associated symptoms that resolved by itself in within a few minutes.    In ED CXR showed bronchitic changes without  acute infiltrate. EKG NSR, 75 bpm, RBBB, no acute ST or T wave abnormality. K+ 3.8, SCr 1.20. BNP normal. Troponin negative x 2. His verapamil was discontinued 2/2 heart rates in the 50's to 60's. He was advised to have an outpatient echo and 14 day event monitor. With the discontinuation of his verapamil losartan was added for treatment of his HTN.   In hospital follow up, since discontinuing verapamil and starting losartan he reported feeling much better. He had not had any further palpitations and has not noted any low heart rates. He had not had any further chest pain since his visit to the [facility] ED. He had not started his event monitor at that time. Of note, he did report 2 syncopal episodes years ago (not in [place]) that occurred while driving. Work up was unrevealing per patient, including outpatient cardiac monitoring.   Echo showed EF of 60-65%, normal wall motion, GR1DD, left atrium was mildly dilated, PASP normal. Event monitor has yet to be placed.   Today he notes some vertigo, otherwise he has been feeling good. He does note continued fatigue that require him to take breaks when ambulating from his car into a store, though his SOB is improved. He notes his bradycardia is resolved since stopping the verapamil. His weight has been stable. No orthopnea or lower extremity edema. No further chest pains. He has a long history of positional vertigo. He notes if he looks up or looks down the room will start to spin around him. Symptoms just started today. He does not have any medications  to take at home. No palpitations or diaphoresis.       Past Medical History  Diagnosis Date  . Hypertension   . Vertigo   . Syncope   . Prostate cancer   . Normal coronary arteries     a. cardiac cath 1989: normal coronary arteries; b. nuclear stress test: no significant ischemia or scar, EF >65%   . Diastolic dysfunction     a. echo 08/2014: EF 60-65%, nl wall motion, GR1DD, mildly dilated LA, nl  PASP  :  Past Surgical History  Procedure Laterality Date  . Hernia repair    :  Social History:  The patient  reports that he quit smoking about 3 months ago. His smoking use included Cigarettes. He has a 17.5 pack-year smoking history. He does not have any smokeless tobacco history on file. He reports that he does not drink alcohol or use illicit drugs.   Family History  Problem Relation Age of Onset  . Heart disease Mother   . Heart attack Mother 7  . Hyperlipidemia Mother   . Hypertension Mother   . Heart disease Sister   . Heart attack Sister 29  . Heart disease Brother   . Heart attack Brother 23     Allergies:  No Known Allergies   Home Medications:  Current Outpatient Prescriptions  Medication Sig Dispense Refill  . amLODipine (NORVASC) 10 MG tablet Take 10 mg by mouth daily.    Marland Kitchen levothyroxine (SYNTHROID, LEVOTHROID) 25 MCG tablet Take 25 mcg by mouth daily.    Marland Kitchen losartan (COZAAR) 25 MG tablet Take 1 tablet (25 mg total) by mouth daily. 30 tablet 6   No current facility-administered medications for this visit.    Review of Systems:  Review of Systems  Constitutional: Positive for malaise/fatigue. Negative for fever, chills, weight loss and diaphoresis.  HENT: Positive for congestion.   Eyes: Negative for blurred vision, discharge and redness.  Respiratory: Positive for cough. Negative for hemoptysis, sputum production, shortness of breath and wheezing.        Longstanding cough  Cardiovascular: Negative for chest pain, palpitations, orthopnea, claudication, leg swelling and PND.  Gastrointestinal: Negative for heartburn, nausea, vomiting, abdominal pain, blood in stool and melena.  Genitourinary: Negative for hematuria.  Musculoskeletal: Negative for falls.  Skin: Negative for itching and rash.  Neurological: Positive for weakness. Negative for dizziness, sensory change, speech change, focal weakness, loss of consciousness and headaches.       Positive  for vertigo  Endo/Heme/Allergies: Does not bruise/bleed easily.  Psychiatric/Behavioral: Negative for hallucinations. The patient is not nervous/anxious.   All other systems reviewed and are negative.   Physical Exam:  Blood pressure 138/62, height 6' (1.829 m), weight 177 lb 12 oz (80.627 kg). BMI: Body mass index is 24.1 kg/(m^2). General: Pleasant, NAD. Psych: Normal affect. Responds to questions with normal affect.  Neuro: Alert and oriented X 3. Moves all extremities spontaneously. HEENT: Normocephalic, atraumatic. EOM intact. Sclera anicteric.  Neck: Trachea midline. Supple without bruits or JVD. Lungs:  Respirations regular and unlabored. CTA bilaterally without wheezing, crackles, or rhonchi.  Heart: RRR, normal s3, s4. No murmurs, rubs, or gallops.  Abdomen: Soft, non-tender, non-distended, BS + x 4.  Extremities: No clubbing, cyanosis or edema. DP/PT/Radials 2+ and equal bilaterally.   Accessory Clinical Findings:  EKG: NSR, 73 bpm, RBBB, no significant st/t changes   Recent Labs: 08/26/2014: ALT 12*; B Natriuretic Peptide 56.0; BUN 16; Creatinine, Ser 1.20; Hemoglobin 13.4; Platelets 198; Potassium 3.8;  Sodium 139  No results found for requested labs within last 365 days.  CrCl cannot be calculated (Patient has no serum creatinine result on file.).  Weights: Wt Readings from Last 3 Encounters:  09/24/14 177 lb 12 oz (80.627 kg)  08/29/14 176 lb 8 oz (80.06 kg)    Other studies Reviewed: Additional studies/ records that were reviewed today include: prior notes.  Assessment & Plan:  1. Intermittent palpitations:  -14 day event monitor yet to be placed, office replaced original order as this was never received. If he does not have monitor by first half of next week he should call our office. He can bring monitor by our office to get assistance in placement  -Echo showed normal LV function with normal wall motion  -He reports a history of "irregular heart beat" his  whole life   2. History of chest pain:  -No further symptoms  -Echo showed normal LV function with normal wall motion  -Given his persisting fatigue will schedule for Lexiscan Myoview to evaluate for high risk ischemia   3. Fatigue:  -Schedule Lexiscan Myoview as above to evaluate for high risk ischemia  -If normal would have patient follow up with PCP as he asks for an "energy pill"  -No further SOB  4. Bradycardia:  -Improved since discontinuation of his verapamil and being placed on losartan for his HTN  5. HTN:  -Controlled  -Continue losartan   6. Vertigo: -Longstanding issue for him -Symptoms appear to be positional related  -Antivert 50 mg tid prn vertigo -Follow up with PCP   Dispo: -Follow up 3 months  Current medicines are reviewed at length with the patient today.  The patient did not have any concerns regarding medicines.   Christell Faith, PA-C [place], [place] [address] [phone] [place] 09/24/2014, 2:15 PM

## 2014-09-24 NOTE — Telephone Encounter (Signed)
Pharmacy calling need some clarification on Meclizine Please call them about this.

## 2014-09-25 NOTE — Telephone Encounter (Signed)
Pharmacy is aware Ativert take up to TID prn.

## 2014-09-30 ENCOUNTER — Encounter: Admission: RE | Admit: 2014-09-30 | Payer: Medicare Other | Source: Ambulatory Visit

## 2014-10-02 ENCOUNTER — Telehealth: Payer: Self-pay

## 2014-10-02 NOTE — Telephone Encounter (Signed)
S/w pt who states he could not make his 7/5 myoview due to illness. States he would like to reschedule. Myoview 7/12 at 10:00am . Reviewed instructions and location. Pt verbalized understanding.  Indicates he has had 14-day holter monitor in his possession for several days but not yet put it on. Advised patient multiple times to call number included with monitor for instructions and assistance putting it on and if he still needed our assistance to call us.  Pt verbalized understanding with no further questions.

## 2014-10-03 ENCOUNTER — Encounter (INDEPENDENT_AMBULATORY_CARE_PROVIDER_SITE_OTHER): Payer: Medicare Other

## 2014-10-03 DIAGNOSIS — R002 Palpitations: Secondary | ICD-10-CM

## 2014-10-07 ENCOUNTER — Encounter
Admission: RE | Admit: 2014-10-07 | Discharge: 2014-10-07 | Disposition: A | Discharge status: Home / Self Care | Payer: Medicare Other | Source: Ambulatory Visit | Attending: Physician Assistant | Admitting: Physician Assistant

## 2014-10-07 DIAGNOSIS — R0602 Shortness of breath: Secondary | ICD-10-CM

## 2014-10-07 HISTORY — DX: Unspecified asthma, uncomplicated: J45.909

## 2014-10-07 MED ORDER — TECHNETIUM TC 99M SESTAMIBI - CARDIOLITE
33.0000 | Freq: Once | INTRAVENOUS | Status: AC | PRN
  Administered 2014-10-07: 11:00:00 31.95 via INTRAVENOUS

## 2014-10-07 MED ORDER — TECHNETIUM TC 99M SESTAMIBI - CARDIOLITE
13.0000 | Freq: Once | INTRAVENOUS | Status: AC | PRN
Start: 2014-10-07 — End: 2014-10-07
  Administered 2014-10-07: 14.21 via INTRAVENOUS

## 2014-10-07 MED ORDER — REGADENOSON 0.4 MG/5ML IV SOLN
0.4000 mg | Freq: Once | INTRAVENOUS | Status: DC
  Filled 2014-10-07: qty 5

## 2014-10-08 LAB — NM MYOCAR MULTI W/SPECT W/WALL MOTION / EF
CHL CUP NUCLEAR SRS: 7
CHL CUP RESTING HR STRESS: 62 {beats}/min
LV dias vol: 88 mL
LVSYSVOL: 50 mL
NUC STRESS TID: 1.03
Peak HR: 78 {beats}/min
Percent HR: 59 %
SDS: 0
SSS: 0

## 2014-10-17 ENCOUNTER — Other Ambulatory Visit: Payer: Self-pay | Admitting: *Deleted

## 2014-10-17 DIAGNOSIS — R002 Palpitations: Secondary | ICD-10-CM

## 2014-11-03 ENCOUNTER — Other Ambulatory Visit: Payer: Self-pay

## 2014-11-03 MED ORDER — LOSARTAN POTASSIUM 25 MG PO TABS: 25.0000 mg | ORAL_TABLET | Freq: Every day | ORAL | Status: DC

## 2014-11-03 NOTE — Telephone Encounter (Signed)
Rx(s) sent to pharmacy electronically.  

## 2014-12-12 ENCOUNTER — Other Ambulatory Visit: Payer: Self-pay | Admitting: *Deleted

## 2014-12-12 ENCOUNTER — Telehealth: Payer: Self-pay

## 2014-12-12 MED ORDER — LOSARTAN POTASSIUM 25 MG PO TABS: 25.0000 mg | ORAL_TABLET | Freq: Every day | ORAL | Status: DC

## 2014-12-12 NOTE — Telephone Encounter (Signed)
Rx sent to local pharmacy for Losartan #30 R#3. Pt lost bottle of medication.

## 2014-12-12 NOTE — Telephone Encounter (Signed)
Pt states he needs his "heart medicine" refilled, not sure of the name. Please call.

## 2014-12-17 ENCOUNTER — Encounter: Payer: Self-pay | Admitting: *Deleted

## 2014-12-17 ENCOUNTER — Ambulatory Visit: Payer: Medicare Other | Admitting: Cardiology

## 2015-01-23 ENCOUNTER — Other Ambulatory Visit: Payer: Self-pay | Admitting: Internal Medicine

## 2015-01-23 DIAGNOSIS — M6281 Muscle weakness (generalized): Secondary | ICD-10-CM

## 2015-01-23 DIAGNOSIS — R634 Abnormal weight loss: Secondary | ICD-10-CM

## 2015-01-23 DIAGNOSIS — R5382 Chronic fatigue, unspecified: Secondary | ICD-10-CM

## 2015-01-26 NOTE — Telephone Encounter (Signed)
This encounter was created in error - please disregard.

## 2015-01-30 ENCOUNTER — Ambulatory Visit
Admission: RE | Admit: 2015-01-30 | Discharge: 2015-01-30 | Disposition: A | Discharge status: Home / Self Care | Payer: Medicare Other | Source: Ambulatory Visit | Attending: Internal Medicine | Admitting: Internal Medicine

## 2015-01-30 DIAGNOSIS — R221 Localized swelling, mass and lump, neck: Secondary | ICD-10-CM | POA: Insufficient documentation

## 2015-01-30 DIAGNOSIS — R634 Abnormal weight loss: Secondary | ICD-10-CM | POA: Insufficient documentation

## 2015-01-30 DIAGNOSIS — M47892 Other spondylosis, cervical region: Secondary | ICD-10-CM | POA: Insufficient documentation

## 2015-01-30 DIAGNOSIS — I6521 Occlusion and stenosis of right carotid artery: Secondary | ICD-10-CM | POA: Insufficient documentation

## 2015-01-30 DIAGNOSIS — R5382 Chronic fatigue, unspecified: Secondary | ICD-10-CM

## 2015-01-30 DIAGNOSIS — Z01812 Encounter for preprocedural laboratory examination: Secondary | ICD-10-CM | POA: Insufficient documentation

## 2015-01-30 DIAGNOSIS — M6281 Muscle weakness (generalized): Secondary | ICD-10-CM

## 2015-01-30 LAB — POCT I-STAT CREATININE: CREATININE: 1.2 mg/dL (ref 0.61–1.24)

## 2015-01-30 MED ORDER — IOHEXOL 300 MG/ML  SOLN
75.0000 mL | Freq: Once | INTRAMUSCULAR | Status: AC | PRN
  Administered 2015-01-30: 70 mL via INTRAVENOUS

## 2015-03-10 ENCOUNTER — Ambulatory Visit: Payer: Medicare Other | Admitting: Nurse Practitioner

## 2015-03-26 ENCOUNTER — Ambulatory Visit (INDEPENDENT_AMBULATORY_CARE_PROVIDER_SITE_OTHER): Payer: Medicare Other | Admitting: Nurse Practitioner

## 2015-03-26 ENCOUNTER — Encounter: Payer: Self-pay | Admitting: Nurse Practitioner

## 2015-03-26 VITALS — BP 178/80 | HR 68 | Ht 72.0 in | Wt 176.5 lb

## 2015-03-26 DIAGNOSIS — I779 Disorder of arteries and arterioles, unspecified: Secondary | ICD-10-CM

## 2015-03-26 DIAGNOSIS — R002 Palpitations: Secondary | ICD-10-CM

## 2015-03-26 DIAGNOSIS — I10 Essential (primary) hypertension: Secondary | ICD-10-CM

## 2015-03-26 DIAGNOSIS — I739 Peripheral vascular disease, unspecified: Secondary | ICD-10-CM

## 2015-03-26 MED ORDER — LOSARTAN POTASSIUM 50 MG PO TABS: 50.0000 mg | ORAL_TABLET | Freq: Every day | ORAL | Status: DC

## 2015-03-26 MED ORDER — ASPIRIN EC 81 MG PO TBEC
81.0000 mg | DELAYED_RELEASE_TABLET | Freq: Every day | ORAL | Status: DC
Start: 2015-03-26 — End: 2018-12-27

## 2015-03-26 MED ORDER — ATORVASTATIN CALCIUM 40 MG PO TABS: 40.0000 mg | ORAL_TABLET | Freq: Every day | ORAL | Status: DC

## 2015-03-26 NOTE — Progress Notes (Signed)
Office Visit    Patient Name: Paul Schmitt Date of Encounter: 03/26/2015  Primary Care Provider:  [facility] Primary Cardiologist:  Roni Bread, MD   Chief Complaint    81 year old male with a history of hypertension, diastolic dysfunction, and palpitations who presents for follow-up.  Past Medical History    Past Medical History  Diagnosis Date  . Essential hypertension   . Vertigo   . Syncope   . Prostate cancer ([place])   . Normal coronary arteries     a. cardiac cath 1989: normal coronary arteries; b. 09/2014 nuclear stress test: no significant ischemia or scar, EF 49%.  . Diastolic dysfunction     a. echo 08/2014: EF 60-65%, nl wall motion, GR1DD, mildly dilated LA, nl PASP  . Asthma   . RBBB   . Parotid mass     a. 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  . Carotid arterial disease ([place])     a. 01/2015 CT neck: incidental finding of severe RICA stenosis;  b. 01/2015 Carotid U/S: RICA 25-05%, LICA 39-76%.  . Palpitations     a. 09/2014 Event Monitor: sinus rhythm, rare pvc's, no notable arrhythmia.   Past Surgical History  Procedure Laterality Date  . Hernia repair      Allergies  No Known Allergies  History of Present Illness    81 year old male with the above complex problem list. He was last seen over the summer with complaints of palpitations and fatigue. Subsequent monitoring revealed rare PVCs without notable arrhythmia. He also underwent stress testing which was nonischemic. He was recently evaluated by his primary care provider secondary to a right neck mass. He was found to have a large parotid mass by CT with incidental finding of right internal carotid artery stenosis. Follow-up carotid ultrasound showed nonobstructive bilateral carotid arterial disease. He recently underwent parotid biopsy and pathology is currently pending.  From a cardiac standpoint, he has done reasonably well. He still has some amount of  fatigue but denies chest pain, dyspnea, or palpitations. Further, he denies any recent history of PND, orthopnea, dizziness, syncope, edema, or early satiety. His blood pressure is elevated today and he notes that he has not been taking amlodipine for about 2 months now. He is not sure what his blood pressure runs at home.  Home Medications    Prior to Admission medications   Medication Sig Start Date End Date Taking? Authorizing Provider  levothyroxine (SYNTHROID, LEVOTHROID) 25 MCG tablet Take 25 mcg by mouth daily. 06/10/14  Yes Historical Provider, MD  losartan (COZAAR) 25 MG tablet Take 1 tablet (25 mg total) by mouth daily. 12/12/14  Yes Ryan M Dunn, PA-C  meclizine (ANTIVERT) 50 MG tablet Take 1 tablet (50 mg total) by mouth 3 (three) times daily as needed (for vertigo). 09/24/14  Yes Ryan M Dunn, PA-C  aspirin EC 81 MG tablet Take 1 tablet (81 mg total) by mouth daily. 03/26/15   Rogelia Mire, NP    Review of Systems    He denies chest pain, palpitations, dyspnea, pnd, orthopnea, n, v, dizziness, syncope, edema, weight gain, or early satiety.  All other systems reviewed and are otherwise negative except as noted above.  Physical Exam    VS:  BP 178/80 mmHg  Pulse 68  Ht 6' (1.829 m)  Wt 176 lb 8 oz (80.06 kg)  BMI 23.93 kg/m2 , BMI Body mass index is 23.93 kg/(m^2). GEN: Well  nourished, well developed, in no acute distress. HEENT: normal. Neck: Supple, no JVD, carotid bruits, or masses. Cardiac: RRR, no murmurs, rubs, or gallops. No clubbing, cyanosis, edema.  Radials/DP/PT 2+ and equal bilaterally.  Respiratory:  Respirations regular and unlabored, clear to auscultation bilaterally. GI: Soft, nontender, nondistended, BS + x 4. MS: no deformity or atrophy. Skin: warm and dry, no rash. Neuro:  Strength and sensation are intact. Psych: Normal affect.  Accessory Clinical Findings    ECG - regular sinus rhythm/sinus arrhythmia, right bundle branch block, no acute ST or T  changes.  Assessment & Plan    1.  Palpitations: No recurrence. Monitoring over the summer revealed rare PVCs without significant arrhythmia. No therapy warranted at this time.  2. Fatigue: Status post normal stress test over the summer with normal echo earlier this year. He does have some amount of fatigue however this does not appear to be related to ischemia or heart failure.  3. Essential hypertension: Blood pressure is elevated today. He says that he's been off of amlodipine for 2 months. He doesn't know why he stopped taking it. Review of his last primary care clinic visit notes that they were considering titrating losartan at that time. As his pressures 178/80 today, I will increase losartan to 50 mg daily.  4. Bilateral nonobstructive carotid arterial disease: Recent incidental finding of right carotid stenosis with subsequent ultrasound revealing moderate nonobstructive bilateral carotid arterial disease. We discussed this today. From a prevention standpoint, he and his family member would like to start low-dose aspirin as well as statin therapy. I think this is reasonable although we did discuss that the benefit in 53 year olds is not entirely clear. I will check lipids and LFTs today and add Lipitor 40 as well as aspirin 81. Follow-up LFTs in 6 weeks.  5. Parotid gland mass: Currently undergoing evaluation by otolaryngology. If he requires surgical intervention, he will not need additional cardiac evaluation given recent normal stress test.  6. Disposition: Follow-up labs today with repeat LFTs in 6 weeks. Follow-up with Dr. Ellyn Hack in 6 months or sooner if necessary.   Murray Hodgkins, NP 03/26/2015, 9:11 AM

## 2015-03-26 NOTE — Patient Instructions (Addendum)
Medication Instructions:  Please start aspirin 81 mg once daily Please start Lipitor 40 mg once daily Please increase losartan to 50 mg once daily  Labwork: Liver, lipid today Repeat liver test in 6 weeks: _______________________________  Testing/Procedures: None  Follow-Up: With Dr. Ellyn Hack in 6 months  If you need a refill on your cardiac medications before your next appointment, please call your pharmacy.

## 2015-03-27 LAB — HEPATIC FUNCTION PANEL
ALBUMIN: 3.9 g/dL (ref 3.2–4.6)
ALT: 15 IU/L (ref 0–44)
AST: 16 IU/L (ref 0–40)
Alkaline Phosphatase: 67 IU/L (ref 39–117)
Bilirubin Total: 0.3 mg/dL (ref 0.0–1.2)
Bilirubin, Direct: 0.08 mg/dL (ref 0.00–0.40)
TOTAL PROTEIN: 6.6 g/dL (ref 6.0–8.5)

## 2015-03-27 LAB — LIPID PANEL
CHOL/HDL RATIO: 3.2 ratio (ref 0.0–5.0)
Cholesterol, Total: 189 mg/dL (ref 100–199)
HDL: 60 mg/dL (ref >39)
LDL CALC: 118 mg/dL — AB (ref 0–99)
TRIGLYCERIDES: 55 mg/dL (ref 0–149)
VLDL Cholesterol Cal: 11 mg/dL (ref 5–40)

## 2015-05-07 ENCOUNTER — Other Ambulatory Visit
Admission: RE | Admit: 2015-05-07 | Discharge: 2015-05-07 | Disposition: A | Discharge status: Home / Self Care | Payer: Medicare Other | Source: Ambulatory Visit | Attending: Nurse Practitioner | Admitting: Nurse Practitioner

## 2015-05-07 ENCOUNTER — Other Ambulatory Visit: Payer: Medicare Other

## 2015-05-07 DIAGNOSIS — R002 Palpitations: Secondary | ICD-10-CM | POA: Diagnosis present

## 2015-05-07 DIAGNOSIS — I10 Essential (primary) hypertension: Secondary | ICD-10-CM | POA: Insufficient documentation

## 2015-05-07 DIAGNOSIS — I779 Disorder of arteries and arterioles, unspecified: Secondary | ICD-10-CM | POA: Diagnosis not present

## 2015-05-07 DIAGNOSIS — I739 Peripheral vascular disease, unspecified: Secondary | ICD-10-CM

## 2015-05-07 LAB — HEPATIC FUNCTION PANEL
ALK PHOS: 72 U/L (ref 38–126)
ALT: 16 U/L — ABNORMAL LOW (ref 17–63)
AST: 22 U/L (ref 15–41)
Albumin: 4.2 g/dL (ref 3.5–5.0)
BILIRUBIN DIRECT: 0.1 mg/dL (ref 0.1–0.5)
BILIRUBIN INDIRECT: 0.7 mg/dL (ref 0.3–0.9)
BILIRUBIN TOTAL: 0.8 mg/dL (ref 0.3–1.2)
Total Protein: 7.7 g/dL (ref 6.5–8.1)

## 2015-11-30 ENCOUNTER — Encounter: Payer: Self-pay | Admitting: Medical Oncology

## 2015-11-30 ENCOUNTER — Emergency Department
Admission: EM | Admit: 2015-11-30 | Discharge: 2015-11-30 | Disposition: A | Discharge status: Home / Self Care | Payer: Medicare Other | Attending: Emergency Medicine | Admitting: Emergency Medicine

## 2015-11-30 ENCOUNTER — Emergency Department: Payer: Medicare Other

## 2015-11-30 DIAGNOSIS — Z7982 Long term (current) use of aspirin: Secondary | ICD-10-CM | POA: Insufficient documentation

## 2015-11-30 DIAGNOSIS — Z8546 Personal history of malignant neoplasm of prostate: Secondary | ICD-10-CM | POA: Diagnosis not present

## 2015-11-30 DIAGNOSIS — S161XXA Strain of muscle, fascia and tendon at neck level, initial encounter: Secondary | ICD-10-CM

## 2015-11-30 DIAGNOSIS — Z79899 Other long term (current) drug therapy: Secondary | ICD-10-CM | POA: Diagnosis not present

## 2015-11-30 DIAGNOSIS — Y999 Unspecified external cause status: Secondary | ICD-10-CM | POA: Insufficient documentation

## 2015-11-30 DIAGNOSIS — J45909 Unspecified asthma, uncomplicated: Secondary | ICD-10-CM | POA: Insufficient documentation

## 2015-11-30 DIAGNOSIS — Y92410 Unspecified street and highway as the place of occurrence of the external cause: Secondary | ICD-10-CM | POA: Insufficient documentation

## 2015-11-30 DIAGNOSIS — F17210 Nicotine dependence, cigarettes, uncomplicated: Secondary | ICD-10-CM | POA: Insufficient documentation

## 2015-11-30 DIAGNOSIS — S46912A Strain of unspecified muscle, fascia and tendon at shoulder and upper arm level, left arm, initial encounter: Secondary | ICD-10-CM

## 2015-11-30 DIAGNOSIS — Y9389 Activity, other specified: Secondary | ICD-10-CM | POA: Diagnosis not present

## 2015-11-30 DIAGNOSIS — I10 Essential (primary) hypertension: Secondary | ICD-10-CM | POA: Insufficient documentation

## 2015-11-30 DIAGNOSIS — I2510 Atherosclerotic heart disease of native coronary artery without angina pectoris: Secondary | ICD-10-CM | POA: Insufficient documentation

## 2015-11-30 DIAGNOSIS — M25512 Pain in left shoulder: Secondary | ICD-10-CM | POA: Diagnosis present

## 2015-11-30 MED ORDER — NAPROXEN 500 MG PO TABS: 500.0000 mg | ORAL_TABLET | Freq: Two times a day (BID) | ORAL | 0 refills | Status: DC

## 2015-11-30 MED ORDER — TRAMADOL HCL 50 MG PO TABS: 50.0000 mg | ORAL_TABLET | Freq: Every day | ORAL | 0 refills | Status: DC
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
## 2015-11-30 NOTE — ED Notes (Signed)
Pt discharged home after verbalizing understanding of discharge instructions; nad noted. 
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
## 2016-01-11 ENCOUNTER — Ambulatory Visit (INDEPENDENT_AMBULATORY_CARE_PROVIDER_SITE_OTHER): Payer: Medicare Other | Admitting: Nurse Practitioner

## 2016-01-11 ENCOUNTER — Encounter: Payer: Self-pay | Admitting: Nurse Practitioner

## 2016-01-11 VITALS — BP 144/72 | HR 57 | Ht 72.0 in | Wt 168.0 lb

## 2016-01-11 DIAGNOSIS — I779 Disorder of arteries and arterioles, unspecified: Secondary | ICD-10-CM | POA: Diagnosis not present

## 2016-01-11 DIAGNOSIS — I10 Essential (primary) hypertension: Secondary | ICD-10-CM | POA: Diagnosis not present

## 2016-01-11 DIAGNOSIS — E785 Hyperlipidemia, unspecified: Secondary | ICD-10-CM

## 2016-01-11 DIAGNOSIS — I739 Peripheral vascular disease, unspecified: Secondary | ICD-10-CM

## 2016-01-11 DIAGNOSIS — R002 Palpitations: Secondary | ICD-10-CM | POA: Diagnosis not present

## 2016-01-11 NOTE — Progress Notes (Signed)
Office Visit    Patient Name: Paul Schmitt Date of Encounter: 01/11/2016  Primary Care Provider:  [facility] Primary Cardiologist:  Previously D. Ellyn Hack, MD - will f/u with C. End, MD going forward.  Chief Complaint    82 y/o ? with a h/o HTN, palpitations, and diastolic dysfxn who presents for f/u.  Past Medical History    Past Medical History:  Diagnosis Date  . Asthma   . Carotid arterial disease ([place])    a. 01/2015 CT neck: incidental finding of severe RICA stenosis;  b. 01/2015 Carotid U/S: RICA 03-50%, LICA 09-38%.  . Diastolic dysfunction    a. echo 08/2014: EF 60-65%, nl wall motion, GR1DD, mildly dilated LA, nl PASP  . Essential hypertension   . Normal coronary arteries    a. cardiac cath 1989: normal coronary arteries; b. 09/2014 nuclear stress test: no significant ischemia or scar, EF 49%.  . Palpitations    a. 09/2014 Event Monitor: sinus rhythm, rare pvc's, no notable arrhythmia.  . Parotid mass    a. 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  . Prostate cancer ([place])   . RBBB   . Syncope   . Vertigo    Past Surgical History:  Procedure Laterality Date  . HERNIA REPAIR      Allergies  No Known Allergies  History of Present Illness    82 y/o ? with the above complex PMH including HTN, palpitations, fatigue, RBBB, tobacco abuse (1 pack/week), asthma, and R parotid mass.  In the summer of 2016, he c/o palpitations and fatigue and was noted to have rare PVCs on monitoring, while stress testing was non-ischemic.  He later had evaluation of R neck mass/parotid mass with bx late last year.  He says that a friend recommended that he try some sort of horse salve to put on it, and after doing so, the mass shrunk.  He says he has since f/u with ENT and there are no plans for surgery @ this time.  Since his last visit with [place] in 02/2015, he has done quite well.  He denies chest pain, palpitations, dyspnea, pnd, orthopnea,  n, v, dizziness, syncope, edema, weight gain, or early satiety.  His BP had been trending high and amlodipine was added to his regimen by his PCP.  He has been tolerating this well and periodically checks his BP @ his local pharmacy and has noticed improvement.  He continues to smoke a few cigarettes/day and has not considered quitting.  Home Medications    Prior to Admission medications   Medication Sig Start Date End Date Taking? Authorizing Provider  amLODipine (NORVASC) 10 MG tablet Take 10 mg by mouth daily.   Yes Historical Provider, MD  aspirin EC 81 MG tablet Take 1 tablet (81 mg total) by mouth daily. 03/26/15  Yes Rogelia Mire, NP  atorvastatin (LIPITOR) 40 MG tablet Take 1 tablet (40 mg total) by mouth daily. 03/26/15  Yes Rogelia Mire, NP  levothyroxine (SYNTHROID, LEVOTHROID) 50 MCG tablet Take 50 mcg by mouth daily before breakfast.   Yes Historical Provider, MD  losartan (COZAAR) 50 MG tablet Take 1 tablet (50 mg total) by mouth daily. 03/26/15  Yes Rogelia Mire, NP  meclizine (ANTIVERT) 50 MG tablet Take 1 tablet (50 mg total) by mouth 3 (three) times daily as needed (for vertigo). 09/24/14  Yes Ryan M Dunn, PA-C  Melatonin 3 MG CAPS Take 3  mg by mouth daily.   Yes Historical Provider, MD  Multiple Vitamin (MULTIVITAMIN) tablet Take 1 tablet by mouth daily.   Yes Historical Provider, MD  naproxen (NAPROSYN) 500 MG tablet Take 1 tablet (500 mg total) by mouth 2 (two) times daily with a meal. 11/30/15  Yes Pierce Crane Beers, PA-C  traMADol (ULTRAM) 50 MG tablet Take 1 tablet (50 mg total) by mouth daily. 11/30/15 11/29/16 Yes Arlyss Repress, PA-C    Review of Systems    Overall doing well.  He denies chest pain, palpitations, dyspnea, pnd, orthopnea, n, v, dizziness, syncope, edema, weight gain, or early satiety.  All other systems reviewed and are otherwise negative except as noted above.  Physical Exam    VS:  BP (!) 144/72 (BP Location: Left Arm, Patient  Position: Sitting, Cuff Size: Normal)   Pulse (!) 57   Ht 6' (1.829 m)   Wt 168 lb (76.2 kg)   BMI 22.78 kg/m  , BMI Body mass index is 22.78 kg/m. GEN: Well nourished, well developed, in no acute distress.  HEENT: normal.  Neck: Supple, no JVD, carotid bruits, or masses. Cardiac: RRR, no murmurs, rubs, or gallops. No clubbing, cyanosis, edema.  Radials/DP/PT 2+ and equal bilaterally.  Respiratory:  Respirations regular and unlabored, diminished breath sounds bilaterally w/ faint exp wheezing @ bases. GI: Soft, nontender, nondistended, BS + x 4. MS: no deformity or atrophy. Skin: warm and dry, no rash. Neuro:  Strength and sensation are intact. Psych: Normal affect.  Accessory Clinical Findings    ECG - Sinus bradycardia, 57, RBBB, no acute st/t changes.  Assessment & Plan    1.  Palpitations:  Quiescent.  Previously noted to have rare PVC's on monitoring in the summer of 2016.  He is not on an AVN blocking agent.  2.  Fatigue:  Somewhat persistent since last year but overall stable.  He has not been having any c/p or dyspnea.  Neg MV in 2016.  Nl EF by echo @ that time.  3.  Essential HTN:  BP relatively stable @ 144/72.  He will continue to monitor periodically @ home.  He remains on losartan and amlodipine and is tolerating both.  4.  Bilateral nonobstructive carotid arterial dzs:  Carotid u/s 02/2015 w/ moderate nonobs bilat carotid dzs.  He is on asa and statin.  5.  HL:  LDL 118 in 02/2015.  He is on statin.  LFT's ok in 04/2015, though he never lipids @ that time.  He says that his PCP has been following his blood work.  6.  R Parotid Mass:  Improved after rubbing some sort of horse salve on the area.  He is followed by ENT.  No plan for surgery @ this time per pt.  7.  Disposition:  F/u in 6 mos.   Paul Hodgkins, NP 01/11/2016, 2:05 PM

## 2016-01-11 NOTE — Patient Instructions (Signed)
Follow-Up: Your physician wants you to follow-up in: 6 months with Dr. Saunders Revel. You will receive a reminder letter in the mail two months in advance. If you don't receive a letter, please call our office to schedule the follow-up appointment.  It was a pleasure seeing you today here in the office. Please do not hesitate to give [place] a call back if you have any further questions. [facility], BSN

## 2016-01-28 ENCOUNTER — Other Ambulatory Visit: Payer: Self-pay | Admitting: *Deleted

## 2016-01-28 MED ORDER — ATORVASTATIN CALCIUM 40 MG PO TABS
40.0000 mg | ORAL_TABLET | Freq: Every day | ORAL | 3 refills | Status: DC
Start: 2016-01-28 — End: 2017-03-23

## 2016-08-17 ENCOUNTER — Ambulatory Visit (INDEPENDENT_AMBULATORY_CARE_PROVIDER_SITE_OTHER): Payer: Medicare Other | Admitting: Nurse Practitioner

## 2016-08-17 ENCOUNTER — Encounter: Payer: Self-pay | Admitting: Nurse Practitioner

## 2016-08-17 VITALS — BP 124/54 | HR 72 | Ht 72.0 in | Wt 170.8 lb

## 2016-08-17 DIAGNOSIS — Z720 Tobacco use: Secondary | ICD-10-CM

## 2016-08-17 DIAGNOSIS — I10 Essential (primary) hypertension: Secondary | ICD-10-CM | POA: Diagnosis not present

## 2016-08-17 DIAGNOSIS — R002 Palpitations: Secondary | ICD-10-CM

## 2016-08-17 DIAGNOSIS — E782 Mixed hyperlipidemia: Secondary | ICD-10-CM

## 2016-08-17 DIAGNOSIS — I779 Disorder of arteries and arterioles, unspecified: Secondary | ICD-10-CM

## 2016-08-17 DIAGNOSIS — R5382 Chronic fatigue, unspecified: Secondary | ICD-10-CM

## 2016-08-17 DIAGNOSIS — I739 Peripheral vascular disease, unspecified: Secondary | ICD-10-CM

## 2016-08-17 NOTE — Progress Notes (Signed)
Office Visit    Patient Name: Paul Schmitt Date of Encounter: 08/17/2016  Primary Care Provider:  Benson Setting, MD Primary Cardiologist:  Prev D. Ellyn Hack, MD - will f/u w/ C. End, MD   Chief Complaint    83 year old male with a history of hypertension, palpitations, diastolic dysfunction, and tobacco abuse, who presents for follow-up.  Past Medical History    Past Medical History:  Diagnosis Date  . Asthma   . Carotid arterial disease ([place])    a. 01/2015 CT neck: incidental finding of severe RICA stenosis;  b. 01/2015 Carotid U/S: RICA 31-49%, LICA 70-26%.  . Diastolic dysfunction    a. echo 08/2014: EF 60-65%, nl wall motion, GR1DD, mildly dilated LA, nl PASP  . Essential hypertension   . Normal coronary arteries    a. cardiac cath 1989: normal coronary arteries; b. 09/2014 nuclear stress test: no significant ischemia or scar, EF 49%.  . Palpitations    a. 09/2014 Event Monitor: sinus rhythm, rare pvc's, no notable arrhythmia.  . Parotid mass    a. 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  . Prostate cancer ([place])   . RBBB   . Syncope   . Vertigo    Past Surgical History:  Procedure Laterality Date  . HERNIA REPAIR      Allergies  No Known Allergies  History of Present Illness    83 year old male with the above past medical history including hypertension, palpitations, fatigue, right bundle branch block, tobacco abuse, carotid vascular disease, asthma, and right parotid mass. We have seen him in the past for palpitations dating back to December 2016. Monitoring showed rare PVCs. Stress testing was nonischemic. I last saw him in October 2017, at which time he was doing reasonably well. He has chronic fatigue which is unchanged at this time. He says simply sometimes he gets up to do something and has to force himself to do it. He does not experience chest pain, dyspnea, claudication, palpitations, PND, orthopnea, dizziness, syncope,  edema, or early satiety. He just says that sometimes he doesn't have the energy to do stuff. This has been present for many years.  Home Medications    Prior to Admission medications   Medication Sig Start Date End Date Taking? Authorizing Provider  amLODipine (NORVASC) 10 MG tablet Take 10 mg by mouth daily.   Yes [provider]  aspirin EC 81 MG tablet Take 1 tablet (81 mg total) by mouth daily. 03/26/15  Yes Rogelia Mire, NP  atorvastatin (LIPITOR) 40 MG tablet Take 1 tablet (40 mg total) by mouth daily. 01/28/16  Yes Rogelia Mire, NP  levothyroxine (SYNTHROID, LEVOTHROID) 50 MCG tablet Take 50 mcg by mouth daily before breakfast.   Yes [provider]  losartan (COZAAR) 50 MG tablet Take 1 tablet (50 mg total) by mouth daily. 03/26/15  Yes Rogelia Mire, NP  Melatonin 3 MG CAPS Take 3 mg by mouth daily.   Yes [provider]  Multiple Vitamin (MULTIVITAMIN) tablet Take 1 tablet by mouth daily.   Yes [provider]    Review of Systems    As above, chronic fatigue which has been stable over many years. He denies chest pain, dyspnea, palpitations, PND, orthopnea, dizziness, syncope, edema, or early satiety. All other systems reviewed and are otherwise negative except as noted above.  Physical Exam    VS:  BP (!) 124/54 (BP Location: Left Arm, Patient Position: Sitting,  Cuff Size: Normal)   Pulse 72   Ht 6' (1.829 m)   Wt 170 lb 12 oz (77.5 kg)   BMI 23.16 kg/m  , BMI Body mass index is 23.16 kg/m. GEN: Well nourished, well developed, in no acute distress.  HEENT: Quarter-sized mass behind the right auricle. Neck: Supple, no JVD, carotid bruits, or masses. Cardiac: RRR, no murmurs, rubs, or gallops. No clubbing, cyanosis, edema.  Radials/DP/PT 2+ and equal bilaterally.  Respiratory:  Respirations regular and unlabored, clear to auscultation bilaterally. GI: Soft, nontender, nondistended, BS + x 4. MS: no deformity or  atrophy. Skin: warm and dry, no rash. Neuro:  Strength and sensation are intact. Psych: Normal affect.  Accessory Clinical Findings    ECG - Regular sinus rhythm, 72, right bundle branch block.  Assessment & Plan    1.  Palpitations: Patient presented complaining of palpitations in 2016 and was noted to have occasional PVCs. He denies any palpitations today. He does have a PVC on his ECG. No further workup required at this time.  2. Chronic fatigue: This has been stable over several years. He has not been having any chest pain, dyspnea, claudication, presyncope, or syncope. Ejection fraction was normal by echo in 2016 with negative Myoview at that time as well. He recently had labs primary care showing a normal TSH, normal B-12, normal potassium, sodium, and creatinine.  3. Essential hypertension: Blood pressure is stable today on amlodipine and losartan.  4. Bilateral carotid arterial disease: Patient had a carotid ultrasound in December 2016 showing moderate nonobstructive bilateral disease. I will arrange for follow-up ultrasound as it has been greater than 1 year. He is on statin therapy.  He says he stopped taking aspirin because he ran out and never bothered to buy another bottle. I recommended that he resume 81 mg of aspirin.  5. Hyperlipidemia: LDL was 118 in December 2016. He remains on statin therapy. I don't see any follow-up lipid since 2016. He says he will address this with his primary care doctor at follow-up. He is not fasting today.  6. Tobacco abuse: He continues to smoke anywhere between one third and one half a pack a day. Complete cessation advised. Says he knows he needs to quit but is not particularly motivated.  7.  Disposition:  F/u carotid u/s.  F/u in 6 mos.  Murray Hodgkins, NP 08/17/2016, 10:46 AM

## 2016-08-17 NOTE — Patient Instructions (Signed)
Follow-Up: Your physician wants you to follow-up in: 1 year with Dr. Saunders Revel. You will receive a reminder letter in the mail two months in advance. If you don't receive a letter, please call our office to schedule the follow-up appointment.  It was a pleasure seeing you today here in the office. Please do not hesitate to give [place] a call back if you have any further questions. [place], BSN

## 2017-03-23 ENCOUNTER — Other Ambulatory Visit: Payer: Self-pay

## 2017-03-23 MED ORDER — ATORVASTATIN CALCIUM 40 MG PO TABS: 40.0000 mg | ORAL_TABLET | Freq: Every day | ORAL | 3 refills | Status: DC

## 2017-03-23 NOTE — Telephone Encounter (Signed)
Requested Prescriptions   Signed Prescriptions Disp Refills  . atorvastatin (LIPITOR) 40 MG tablet 90 tablet 3    Sig: Take 1 tablet (40 mg total) by mouth daily.    Authorizing Provider: Theora Gianotti    Ordering User: Janan Ridge

## 2017-08-06 ENCOUNTER — Emergency Department
Admission: EM | Admit: 2017-08-06 | Discharge: 2017-08-06 | Disposition: A | Discharge status: Home / Self Care | Payer: Medicare Other | Attending: Emergency Medicine | Admitting: Emergency Medicine

## 2017-08-06 ENCOUNTER — Other Ambulatory Visit: Payer: Self-pay

## 2017-08-06 ENCOUNTER — Emergency Department: Payer: Medicare Other

## 2017-08-06 DIAGNOSIS — R42 Dizziness and giddiness: Secondary | ICD-10-CM | POA: Insufficient documentation

## 2017-08-06 DIAGNOSIS — Z79899 Other long term (current) drug therapy: Secondary | ICD-10-CM | POA: Insufficient documentation

## 2017-08-06 DIAGNOSIS — J45909 Unspecified asthma, uncomplicated: Secondary | ICD-10-CM | POA: Diagnosis not present

## 2017-08-06 DIAGNOSIS — R531 Weakness: Secondary | ICD-10-CM | POA: Insufficient documentation

## 2017-08-06 DIAGNOSIS — F17210 Nicotine dependence, cigarettes, uncomplicated: Secondary | ICD-10-CM | POA: Diagnosis not present

## 2017-08-06 DIAGNOSIS — Z7982 Long term (current) use of aspirin: Secondary | ICD-10-CM | POA: Insufficient documentation

## 2017-08-06 DIAGNOSIS — I10 Essential (primary) hypertension: Secondary | ICD-10-CM | POA: Insufficient documentation

## 2017-08-06 LAB — CBC WITH DIFFERENTIAL/PLATELET
BASOS PCT: 1 %
Basophils Absolute: 0 10*3/uL (ref 0–0.1)
EOS PCT: 4 %
Eosinophils Absolute: 0.3 10*3/uL (ref 0–0.7)
HCT: 38.1 % — ABNORMAL LOW (ref 40.0–52.0)
HEMOGLOBIN: 12.6 g/dL — AB (ref 13.0–18.0)
Lymphocytes Relative: 22 %
Lymphs Abs: 1.3 10*3/uL (ref 1.0–3.6)
MCH: 29.1 pg (ref 26.0–34.0)
MCHC: 33.1 g/dL (ref 32.0–36.0)
MCV: 87.8 fL (ref 80.0–100.0)
MONO ABS: 0.7 10*3/uL (ref 0.2–1.0)
MONOS PCT: 13 %
NEUTROS ABS: 3.6 10*3/uL (ref 1.4–6.5)
Neutrophils Relative %: 60 %
Platelets: 190 10*3/uL (ref 150–440)
RBC: 4.34 MIL/uL — ABNORMAL LOW (ref 4.40–5.90)
RDW: 15.6 % — ABNORMAL HIGH (ref 11.5–14.5)
WBC: 6 10*3/uL (ref 3.8–10.6)

## 2017-08-06 LAB — URINALYSIS, COMPLETE (UACMP) WITH MICROSCOPIC
Bacteria, UA: NONE SEEN
Bilirubin Urine: NEGATIVE
GLUCOSE, UA: NEGATIVE mg/dL
Hgb urine dipstick: NEGATIVE
Ketones, ur: NEGATIVE mg/dL
Leukocytes, UA: NEGATIVE
NITRITE: NEGATIVE
PH: 5 (ref 5.0–8.0)
PROTEIN: NEGATIVE mg/dL
SPECIFIC GRAVITY, URINE: 1.017 (ref 1.005–1.030)
Squamous Epithelial / LPF: NONE SEEN (ref 0–5)

## 2017-08-06 LAB — COMPREHENSIVE METABOLIC PANEL
ALK PHOS: 86 U/L (ref 38–126)
ALT: 14 U/L — AB (ref 17–63)
ANION GAP: 8 (ref 5–15)
AST: 19 U/L (ref 15–41)
Albumin: 3.7 g/dL (ref 3.5–5.0)
BUN: 15 mg/dL (ref 6–20)
CALCIUM: 9.2 mg/dL (ref 8.9–10.3)
CO2: 26 mmol/L (ref 22–32)
Chloride: 106 mmol/L (ref 101–111)
Creatinine, Ser: 0.98 mg/dL (ref 0.61–1.24)
Glucose, Bld: 90 mg/dL (ref 65–99)
Potassium: 3.6 mmol/L (ref 3.5–5.1)
SODIUM: 140 mmol/L (ref 135–145)
TOTAL PROTEIN: 7.4 g/dL (ref 6.5–8.1)
Total Bilirubin: 0.5 mg/dL (ref 0.3–1.2)

## 2017-08-06 LAB — TROPONIN I
Troponin I: 0.04 ng/mL (ref <0.03)
Troponin I: 0.04 ng/mL (ref <0.03)

## 2017-08-06 MED ORDER — MECLIZINE HCL 25 MG PO TABS: 25.0000 mg | ORAL_TABLET | Freq: Three times a day (TID) | ORAL | 0 refills | Status: DC | PRN

## 2017-08-06 MED ORDER — MECLIZINE HCL 25 MG PO TABS
25.0000 mg | ORAL_TABLET | Freq: Once | ORAL | Status: AC
  Administered 2017-08-06: 25 mg via ORAL
  Filled 2017-08-06: qty 1

## 2017-08-06 NOTE — ED Triage Notes (Signed)
Pt comes via [facility] from home with c/o dizziness and weakness that has been going on for over a week now. Pt states he does have vertigo. Pt also states that he thinks he may have had a mini stroke last Wednesday. Pt is alert and orientedX4.

## 2017-08-06 NOTE — ED Notes (Signed)
Pt given meal tray and drink.

## 2017-08-06 NOTE — ED Provider Notes (Signed)
[facility] Emergency Department Provider Note   ____________________________________________   I have reviewed the triage vital signs and the nursing notes.   HISTORY  Chief Complaint Dizziness and Weakness   History limited by: Not Limited   HPI Paul Schmitt is a 84 y.o. male who presents to the emergency department today because of concerns for dizziness.  Patient states symptom has been present for about a week and a half.  It came on gradually.  It does wax and wane.  He has not noticed any pattern to when it comes on.  Feels lightheaded.  He denies any chest pain or palpitations.  He states he does get short of breath from time to time.  Denies any nausea or vomiting.  No recent fevers.   Per medical record review patient has a history of vertigo.  Past Medical History:  Diagnosis Date  . Asthma   . Carotid arterial disease ([place])    a. 01/2015 CT neck: incidental finding of severe RICA stenosis;  b. 01/2015 Carotid U/S: RICA 97-67%, LICA 34-19%.  . Diastolic dysfunction    a. echo 08/2014: EF 60-65%, nl wall motion, GR1DD, mildly dilated LA, nl PASP  . Essential hypertension   . Normal coronary arteries    a. cardiac cath 1989: normal coronary arteries; b. 09/2014 nuclear stress test: no significant ischemia or scar, EF 49%.  . Palpitations    a. 09/2014 Event Monitor: sinus rhythm, rare pvc's, no notable arrhythmia.  . Parotid mass    a. 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  . Prostate cancer ([place])   . RBBB   . Syncope   . Vertigo     Patient Active Problem List   Diagnosis Date Noted  . Palpitations   . Carotid arterial disease ([place])   . Essential hypertension   . Vertigo   . Diastolic dysfunction   . Syncope   . Intermittent palpitations 08/26/2014  . Chest tightness 08/26/2014  . Fatigue - with exercise intolerance 08/26/2014  . Bradycardia 08/26/2014    Past Surgical History:  Procedure  Laterality Date  . HERNIA REPAIR      Prior to Admission medications   Medication Sig Start Date End Date Taking? Authorizing Provider  amLODipine (NORVASC) 10 MG tablet Take 10 mg by mouth daily.    [provider]  aspirin EC 81 MG tablet Take 1 tablet (81 mg total) by mouth daily. 03/26/15   Theora Gianotti, NP  atorvastatin (LIPITOR) 40 MG tablet Take 1 tablet (40 mg total) by mouth daily. 03/23/17   Theora Gianotti, NP  levothyroxine (SYNTHROID, LEVOTHROID) 50 MCG tablet Take 50 mcg by mouth daily before breakfast.    [provider]  losartan (COZAAR) 50 MG tablet Take 1 tablet (50 mg total) by mouth daily. 03/26/15   Theora Gianotti, NP  Melatonin 3 MG CAPS Take 3 mg by mouth daily.    [provider]  Multiple Vitamin (MULTIVITAMIN) tablet Take 1 tablet by mouth daily.    [provider]    Allergies Patient has no known allergies.  Family History  Problem Relation Age of Onset  . Heart disease Mother   . Heart attack Mother 35  . Hyperlipidemia Mother   . Hypertension Mother   . Heart disease Sister   . Heart attack Sister 66  . Heart disease Brother   . Heart attack Brother 75    Social History  Social History   Tobacco Use  . Smoking status: Current Some Day Smoker    Packs/day: 0.25    Years: 70.00    Pack years: 17.50    Types: Cigarettes    Last attempt to quit: 05/29/2014    Years since quitting: 3.1  . Smokeless tobacco: Never Used  Substance Use Topics  . Alcohol use: No  . Drug use: No    Review of Systems Constitutional: No fever/chills Eyes: No visual changes. ENT: No sore throat. Cardiovascular: Denies chest pain. Respiratory: Positive for shortness of breath. Gastrointestinal: No abdominal pain.  No nausea, no vomiting.  No diarrhea.   Genitourinary: Negative for dysuria. Musculoskeletal: Negative for back pain. Skin: Negative for rash. Neurological: Positive for  dizziness.  ____________________________________________   PHYSICAL EXAM:  VITAL SIGNS: ED Triage Vitals  Enc Vitals Group     BP 08/06/17 1837 (!) 187/80     Pulse Rate 08/06/17 1837 (!) 58     Resp 08/06/17 1837 14     Temp --      Temp src --      SpO2 08/06/17 1837 100 %     Weight 08/06/17 1836 150 lb (68 kg)     Height 08/06/17 1836 6' (1.829 m)     Head Circumference --      Peak Flow --      Pain Score 08/06/17 1834 0   Constitutional: Alert and oriented. Well appearing and in no distress. Eyes: Conjunctivae are normal.  ENT   Head: Normocephalic and atraumatic.   Nose: No congestion/rhinnorhea.   Mouth/Throat: Mucous membranes are moist.   Neck: No stridor. Hematological/Lymphatic/Immunilogical: No cervical lymphadenopathy. Cardiovascular: Normal rate, regular rhythm.  No murmurs, rubs, or gallops.  Respiratory: Normal respiratory effort without tachypnea nor retractions. Breath sounds are clear and equal bilaterally. No wheezes/rales/rhonchi. Gastrointestinal: Soft and non tender. No rebound. No guarding.  Genitourinary: Deferred Musculoskeletal: Normal range of motion in all extremities. No lower extremity edema. Neurologic:  Normal speech and language. No gross focal neurologic deficits are appreciated.  Skin:  Skin is warm, dry and intact. No rash noted. Psychiatric: Mood and affect are normal. Speech and behavior are normal. Patient exhibits appropriate insight and judgment.  ____________________________________________    LABS (pertinent positives/negatives)  UA not consistent with infection CMP wnl except alt 14 Trop 0.04 x 2 CBC wbc 6.0, hgb 12.6, plt 190  ____________________________________________   EKG  I, Nance Pear, attending physician, personally viewed and interpreted this EKG  EKG Time: 1836 Rate: 68 Rhythm: sinus rhythm Axis: normal Intervals: qtc 466 QRS: RBBB ST changes: no st elevation Impression: abnormal  ekg   ____________________________________________    RADIOLOGY  CT head No acute findings   ____________________________________________   PROCEDURES  Procedures  ____________________________________________   INITIAL IMPRESSION / ASSESSMENT AND PLAN / ED COURSE  Pertinent labs & imaging results that were available during my care of the patient were reviewed by me and considered in my medical decision making (see chart for details).  Patient presented to the emergency department today because of concerns for dizziness.  On exam patient no acute distress.  Differential would be broad including anemia infection intracranial bleed mass or stroke.  Initially patient today was going on for about a week and a half.  However in further discussion with patient he states that he has this lightheaded feeling chronically.  Blood work shows very minimal anemia.  Troponin is 0.04 and repeat stayed the same.  At this  point I doubt ACS.  Head CT without any acute lesions.  Patient did feel better after meclizine.  Will plan on discharging with similar prescription.  Discussed importance of primary care follow-up.   ____________________________________________   FINAL CLINICAL IMPRESSION(S) / ED DIAGNOSES  Final diagnoses:  Dizziness  Vertigo     Note: This dictation was prepared with Dragon dictation. Any transcriptional errors that result from this process are unintentional     Nance Pear, MD 08/07/17 1527

## 2017-08-06 NOTE — ED Notes (Signed)
Pt ambulated without difficulty with his cane. Pt stated no dizziness or weakness. Pt also stated that he felt so much better than before.

## 2017-08-06 NOTE — ED Notes (Signed)
Patient transported to CT 

## 2017-08-06 NOTE — ED Notes (Signed)
Date and time results received: 08/06/17 1950 (use smartphrase ".now" to insert current time)  Test: troponin Critical Value: 0.04  Name of Provider Notified: Archie Balboa  Orders Received? Or Actions Taken?: Orders Received - See Orders for details

## 2017-08-06 NOTE — Discharge Instructions (Addendum)
Please seek medical attention for any high fevers, chest pain, shortness of breath, change in behavior, persistent vomiting, bloody stool or any other new or concerning symptoms.  

## 2017-08-06 NOTE — ED Notes (Signed)

## 2017-12-21 ENCOUNTER — Other Ambulatory Visit: Payer: Self-pay | Admitting: Internal Medicine

## 2017-12-21 ENCOUNTER — Ambulatory Visit
Admission: RE | Admit: 2017-12-21 | Discharge: 2017-12-21 | Disposition: A | Discharge status: Home / Self Care | Payer: Medicare Other | Source: Ambulatory Visit | Attending: Internal Medicine | Admitting: Internal Medicine

## 2017-12-21 DIAGNOSIS — R05 Cough: Secondary | ICD-10-CM | POA: Diagnosis not present

## 2017-12-21 DIAGNOSIS — R059 Cough, unspecified: Secondary | ICD-10-CM

## 2018-02-14 ENCOUNTER — Other Ambulatory Visit
Admission: RE | Admit: 2018-02-14 | Discharge: 2018-02-14 | Disposition: A | Discharge status: Home / Self Care | Payer: Medicare Other | Source: Ambulatory Visit | Attending: Internal Medicine | Admitting: Internal Medicine

## 2018-02-14 DIAGNOSIS — N183 Chronic kidney disease, stage 3 (moderate): Secondary | ICD-10-CM | POA: Insufficient documentation

## 2018-02-14 LAB — BASIC METABOLIC PANEL
Anion gap: 8 (ref 5–15)
BUN: 15 mg/dL (ref 8–23)
CALCIUM: 9.1 mg/dL (ref 8.9–10.3)
CHLORIDE: 103 mmol/L (ref 98–111)
CO2: 27 mmol/L (ref 22–32)
Creatinine, Ser: 1.28 mg/dL — ABNORMAL HIGH (ref 0.61–1.24)
GFR calc Af Amer: 54 mL/min — ABNORMAL LOW (ref >60)
GFR calc non Af Amer: 47 mL/min — ABNORMAL LOW (ref >60)
GLUCOSE: 95 mg/dL (ref 70–99)
POTASSIUM: 3.7 mmol/L (ref 3.5–5.1)
SODIUM: 138 mmol/L (ref 135–145)

## 2018-03-16 ENCOUNTER — Telehealth: Payer: Self-pay | Admitting: Student

## 2018-03-16 ENCOUNTER — Other Ambulatory Visit: Payer: Medicare Other | Admitting: Student

## 2018-03-16 DIAGNOSIS — Z515 Encounter for palliative care: Secondary | ICD-10-CM

## 2018-03-16 NOTE — Telephone Encounter (Signed)
NP phone call at 1:35pm. NP spoke with Claiborne Billings at [facility] to follow up with where they were with getting patient's oxygen in the home. She states she was working triage, but would leave message for another nurse to follow up on Mr. Laurich's oxygen.

## 2018-03-16 NOTE — Telephone Encounter (Signed)
NP phone call at 1:20pm. NP spoke with Helene Kelp to follow up on oxygen in the home. She states there is a current order for oxygen. She is asked to fax over to [place].

## 2018-03-16 NOTE — Progress Notes (Signed)
[facility] Telephone: [phone] Fax: [phone]  PATIENT NAME: Paul Schmitt DOB: 10/06/1922 MRN: [id number]  PRIMARY CARE PROVIDER:   Benson Setting, MD  REFERRING PROVIDER:  Benson Setting, MD [address] [facility] [id number] [facility] [place], [place] [address]  RESPONSIBLE PARTY:    ASSESSMENT: [name] is a 84 year old male with multiple medical problems including Stage III lung cancer, atrial fibrillation, ischemic stroke, CKD, asthma, hypertension, prostate cancer. He is resting in bed upon arrival. He is alert and oriented x 3; no acute distress. Some of his words are garbled, but he is able to make needs known. We discussed role of Palliative Medicine. We discussed his disease process. We discussed symptom management. We discussed code status; he states he would like to have CPR attempted.     RECOMMENDATIONS and PLAN:  1. Code status: Full Code; [name] would like for CPR to be attempted. This will be an ongoing discussion. 2. Medical goals of therapy: At this time, goals of therapy focus on patient receiving radiation. Will provide comfort and symptom management. Patient will continue to receive HHPT and HHA services through [facility]. 3. Symptom management: dyspnea-continue Albuterol 2 puffs every 6 hours prn; patient and caregiver are to check on wedge cushion for bed with therapist/[facility].  4. Discharge Planning: Patient will continue to reside at home with private caregivers. 5. Emotional/spiritual support: Discussed with Mr. Meacham and caregiver Adonis Brook; they are encouraged to call with questions.  Palliative Care to continue to follow for emotional/spiritual support, ongoing discussions trajectory of chronic disease progression, medical goals of therapy, monitor for symptoms with management, and reduce ED and hospitalizations with recommendations.  NP spoke with Helene Kelp regarding oxygen; she states oxygen has been  ordered. NP left message with Claiborne Billings at [facility] to see where they were at in the process of getting oxygen in the home.   Palliative Medicine to follow up in 4 weeks or sooner, if needed.   I spent 45 minutes providing this consultation,  from 11:15am to 12:00pm. More than 50% of the time in this consultation was spent coordinating communication.   HISTORY OF PRESENT ILLNESS:  Paul Schmitt is a 84 y.o. year old male with multiple medical problems including Stage III lung cancer, atrial fibrillation, ischemic stroke, CKD, asthma, hypertension, prostate cancer. Palliative Care was asked to help address goals of care. [name] currently resides at home with his wife and private caregivers. He is currently receiving PT and home health aid services with [facility]. He requires assistance with adl's. He is ambulatory with a walker. He reports fatigue. He denies pain. He reports cough; denies hemoptysis. He reports dyspnea with exertion, sometimes at rest or when lying down. He states his appetite "is not good." He states he has lost 25 pounds in the past six months. He has recently started drinking nutritional supplements three times a day. He is currently receiving radiation; 7 of 10 sessions so far and is scheduled to go for radiation this afternoon. Mr. Boer states that his doctor was in process of getting oxygen in the home for his shortness of breath. [name] states he would like to remain in his home and focus on his quality of life for as long as possible.   CODE STATUS: Full Code  PPS: 50% HOSPICE ELIGIBILITY/DIAGNOSIS: TBD  PAST MEDICAL HISTORY:  Past Medical History:  Diagnosis Date  . Asthma   . Carotid arterial disease ([place])  a. 01/2015 CT neck: incidental finding of severe RICA stenosis;  b. 01/2015 Carotid U/S: RICA 54-56%, LICA 25-63%.  . Diastolic dysfunction    a. echo 08/2014: EF 60-65%, nl wall motion, GR1DD, mildly dilated LA, nl PASP  . Essential  hypertension   . Normal coronary arteries    a. cardiac cath 1989: normal coronary arteries; b. 09/2014 nuclear stress test: no significant ischemia or scar, EF 49%.  . Palpitations    a. 09/2014 Event Monitor: sinus rhythm, rare pvc's, no notable arrhythmia.  . Parotid mass    a. 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  . Prostate cancer ([place])   . RBBB   . Syncope   . Vertigo     SOCIAL HX:  Social History   Tobacco Use  . Smoking status: Current Some Day Smoker    Packs/day: 0.25    Years: 70.00    Pack years: 17.50    Types: Cigarettes    Last attempt to quit: 05/29/2014    Years since quitting: 3.8  . Smokeless tobacco: Never Used  Substance Use Topics  . Alcohol use: No    ALLERGIES: No Known Allergies   PERTINENT MEDICATIONS:  Outpatient Encounter Medications as of 03/16/2018  Medication Sig  . albuterol (PROVENTIL HFA;VENTOLIN HFA) 108 (90 Base) MCG/ACT inhaler Inhale 2 puffs into the lungs every 6 (six) hours as needed for wheezing or shortness of breath.  Marland Kitchen amLODipine (NORVASC) 10 MG tablet Take 10 mg by mouth daily.  Marland Kitchen atorvastatin (LIPITOR) 40 MG tablet Take 1 tablet (40 mg total) by mouth daily.  Marland Kitchen doxazosin (CARDURA) 2 MG tablet Take 2 mg by mouth at bedtime.  Marland Kitchen levothyroxine (SYNTHROID, LEVOTHROID) 50 MCG tablet Take 50 mcg by mouth daily before breakfast.  . aspirin EC 81 MG tablet Take 1 tablet (81 mg total) by mouth daily.  Marland Kitchen losartan (COZAAR) 50 MG tablet Take 1 tablet (50 mg total) by mouth daily. (Patient not taking: Reported on 03/16/2018)  . meclizine (ANTIVERT) 25 MG tablet Take 1 tablet (25 mg total) by mouth 3 (three) times daily as needed for dizziness.  . Melatonin 3 MG CAPS Take 3 mg by mouth daily.  . Multiple Vitamin (MULTIVITAMIN) tablet Take 1 tablet by mouth daily.   No facility-administered encounter medications on file as of 03/16/2018.     PHYSICAL EXAM:   General: NAD, frail appearing,  thin Cardiovascular: regular rate and rhythm Pulmonary: left lobes diminished, right lobes clear Abdomen: soft, nontender, + bowel sounds GU: no suprapubic tenderness Extremities: no edema, no joint deformities Skin: no rashes Neurological: Weakness but otherwise nonfocal  Ezekiel Slocumb, NP

## 2018-05-08 ENCOUNTER — Telehealth: Payer: Self-pay

## 2018-05-08 MED ORDER — ATORVASTATIN CALCIUM 40 MG PO TABS: 40.0000 mg | ORAL_TABLET | Freq: Every day | ORAL | 0 refills | Status: DC

## 2018-05-08 NOTE — Telephone Encounter (Signed)
RX for atorvastatin sent to [facility].

## 2018-06-14 ENCOUNTER — Other Ambulatory Visit: Payer: Self-pay

## 2018-06-14 MED ORDER — ATORVASTATIN CALCIUM 40 MG PO TABS: 40.0000 mg | ORAL_TABLET | Freq: Every day | ORAL | 0 refills | Status: DC

## 2018-06-14 NOTE — Telephone Encounter (Signed)
*  STAT* If patient is at the pharmacy, call can be transferred to refill team.   1. Which medications need to be refilled? (please list name of each medication and dose if known) Atorvastatin  2. Which pharmacy/location (including street and city if local pharmacy) is medication to be sent to? [facility]  3. Do they need a 30 day or 90 day supply? [place]

## 2018-07-02 ENCOUNTER — Other Ambulatory Visit: Payer: Medicare HMO | Admitting: Primary Care

## 2018-07-02 ENCOUNTER — Other Ambulatory Visit: Payer: Self-pay

## 2018-07-02 DIAGNOSIS — Z515 Encounter for palliative care: Secondary | ICD-10-CM

## 2018-07-02 NOTE — Progress Notes (Signed)
  Opened in error, pcg requests telemedicine visit tomorrow 07/03/2018.

## 2018-07-03 ENCOUNTER — Other Ambulatory Visit: Payer: Self-pay

## 2018-07-03 ENCOUNTER — Other Ambulatory Visit: Payer: Medicare HMO | Admitting: Primary Care

## 2018-07-03 ENCOUNTER — Telehealth: Payer: Self-pay | Admitting: Primary Care

## 2018-07-03 DIAGNOSIS — C3492 Malignant neoplasm of unspecified part of left bronchus or lung: Secondary | ICD-10-CM

## 2018-07-03 DIAGNOSIS — Z515 Encounter for palliative care: Secondary | ICD-10-CM

## 2018-07-03 NOTE — Telephone Encounter (Signed)
T/c this am to connect with caregiver after daughter and another care giver asked for me to reach [place]. We discussed covid restrictions and her desire for patient not to have visitors. She requested a telemedicine visit this pm. Instructions for set up of technology sent by email, and zoom invitation sent for 2 pm visit.

## 2018-07-03 NOTE — Progress Notes (Signed)
[facility] Palliative Care Consult Note Telephone: [phone]  Fax: [phone]   TELEHEALTH VISIT STATEMENT Due to the COVID-19 crisis, this visit was done via telemedicine from my office and it was initiated and consent by this patient and or family.  PATIENT NAME: Paul Schmitt DOB: 1922/11/04 MRN: [id number]  PRIMARY CARE PROVIDER:   Benson Setting, MD  Dr. Lorriane Shire- Maxine Glenn'  REFERRING PROVIDER:  Benson Setting, MD [address] [facility] [id number] [facility] [place], [place] [address]  RESPONSIBLE PARTY:  Extended Emergency Contact Information Primary Emergency Contact: Jolayne Haines Mobile Phone: [phone] Relation: Sister Secondary Emergency Contact: Bonna Gains Mobile Phone: [phone] Relation: Friend   Palliative care was asked to consult on this case by Dr Roosvelt Maser. This is subsequent visit. Previous assessment was in December, 2019. Video telemedicine visit with Lorraine Lax, caregiver and Mr. Scaife.  ASSESSMENT and RECOMMENDATIONS:   1.Goals of Care: DNR. SISTER IN LAW [name]  is legal POA. Wife died on hospice about a month ago. Patient has good and bad days from this loss. Had earlier requested full code for himself but after learning about code status during his wife's illness, he changed to a DNR for himself. He does request full scope of treatment otherwise. Will mail a MOST and DNR to home.  2. Dyspnea: Improved with HOB elevation at hs. This is improved with having gotten a hospital bed for [facility] elevation at hs. Prior to the bed he had 3 pillow orthopnea and it was difficult to sleep well. Having the ability to elevate HOB for breathing has improved sleep. Does not need supplemental oxygen now per subjective report. Also has albuterol inhaler to use prn, rare use.  3. Insomnia: Continue trazodone 100 mg at hs. Had started with 50 mg which was not sufficient and MD increased. He no longer takes melatonin or  tylenol pm. He sleeps well with the trazodone and does not have a 'hung over' feeling upon rising.  4. Safety/ fall risk: Using good techniques for safety and fall prevention. Patient has attentive caregivers who ambulate with him using gait belt and stand by assistance. He uses walker. Home has cleared walkways and does not have clutter. Patient states he takes his time and is careful.  Palliative care will continue to follow for goals of care clarification and symptom management. Return 1 month or prn.  I spent 40 minutes providing this consultation,  from 1400 to 1440. More than 50% of the time in this consultation was spent coordinating communication.   HISTORY OF PRESENT ILLNESS:  Paul Schmitt is a 85 y.o. year old male with multiple medical problems including Primary lung squamous cell carcinoma, left staging: cT2b, cN3 with palliative radiation in 12/19, hypothyroid, CVA, CAD, COPD, vertigo. Palliative Care was asked to help address goals of care.   CODE STATUS: DNR  PPS: 40% HOSPICE ELIGIBILITY/DIAGNOSIS: TBD  PAST MEDICAL HISTORY:  Past Medical History:  Diagnosis Date  . Asthma   . Carotid arterial disease ([place])    a. 01/2015 CT neck: incidental finding of severe RICA stenosis;  b. 01/2015 Carotid U/S: RICA 67-20%, LICA 94-70%.  . Diastolic dysfunction    a. echo 08/2014: EF 60-65%, nl wall motion, GR1DD, mildly dilated LA, nl PASP  . Essential hypertension   . Normal coronary arteries    a. cardiac cath 1989: normal coronary arteries; b. 09/2014 nuclear stress test: no significant ischemia or scar, EF 49%.  . Palpitations    a. 09/2014  Event Monitor: sinus rhythm, rare pvc's, no notable arrhythmia.  . Parotid mass    a. 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  . Prostate cancer ([place])   . RBBB   . Syncope   . Vertigo     SOCIAL HX:  Social History   Tobacco Use  . Smoking status: Current Some Day Smoker    Packs/day: 0.25    Years:  70.00    Pack years: 17.50    Types: Cigarettes    Last attempt to quit: 05/29/2014    Years since quitting: 4.0  . Smokeless tobacco: Never Used  Substance Use Topics  . Alcohol use: No    ALLERGIES: No Known Allergies   PERTINENT MEDICATIONS:  Outpatient Encounter Medications as of 07/03/2018  Medication Sig  . albuterol (PROVENTIL HFA;VENTOLIN HFA) 108 (90 Base) MCG/ACT inhaler Inhale 2 puffs into the lungs every 6 (six) hours as needed for wheezing or shortness of breath.  Marland Kitchen amLODipine (NORVASC) 10 MG tablet Take 10 mg by mouth daily.  Marland Kitchen aspirin EC 81 MG tablet Take 1 tablet (81 mg total) by mouth daily.  Marland Kitchen atorvastatin (LIPITOR) 40 MG tablet Take 1 tablet (40 mg total) by mouth daily. Please make annual appt for future refills. Thank you  . doxazosin (CARDURA) 2 MG tablet Take 2 mg by mouth at bedtime.  Marland Kitchen levothyroxine (SYNTHROID, LEVOTHROID) 50 MCG tablet Take 50 mcg by mouth daily before breakfast.  . losartan (COZAAR) 50 MG tablet Take 1 tablet (50 mg total) by mouth daily. (Patient not taking: Reported on 03/16/2018)  . meclizine (ANTIVERT) 25 MG tablet Take 1 tablet (25 mg total) by mouth 3 (three) times daily as needed for dizziness.  . Melatonin 3 MG CAPS Take 3 mg by mouth daily.  . Multiple Vitamin (MULTIVITAMIN) tablet Take 1 tablet by mouth daily.   No facility-administered encounter medications on file as of 07/03/2018.     PHYSICAL EXAM: Per patient/ pcg report and video observance:  148 lbs 05/02/2018, no recent readings.  Weight 156 lb. 6 months ago. Weight 176 in Dec. 2016.  General: NAD, frail appearing, thin Cardiovascular: denies chest pain or palpitations Pulmonary: has baseline Sob, not worsened or  increased, morning production, DOE, using hospital bed for DOE at hs. He no longer has to use oxygen at hs. Abdomen: soft, non tender per report,  endorses good appetite, bms regular GU: Had flank pain a few weeks ago, Rx with abx and now resolved Extremities: no  edema, no joint deformities, denies falls ambulates with a walker with assistance. Skin: no rashes Neurological: Weakness, alert and oriented, insomnia, taking trazodone 100 mg.  Cyndia Skeeters DNP, AGPCNP-BC

## 2018-07-07 IMAGING — CR DG SHOULDER 2+V*L*
1 series · 3 of 3 positions shown · non-contrast
Comparison: None.

CLINICAL DATA: [AGE] female with a history of shoulder pain

EXAM:
LEFT SHOULDER - 2+ VIEW

[Series 1: dg shoulder left · 0.14mm/px · 3 of 3 slices shown]
[im 1/3]
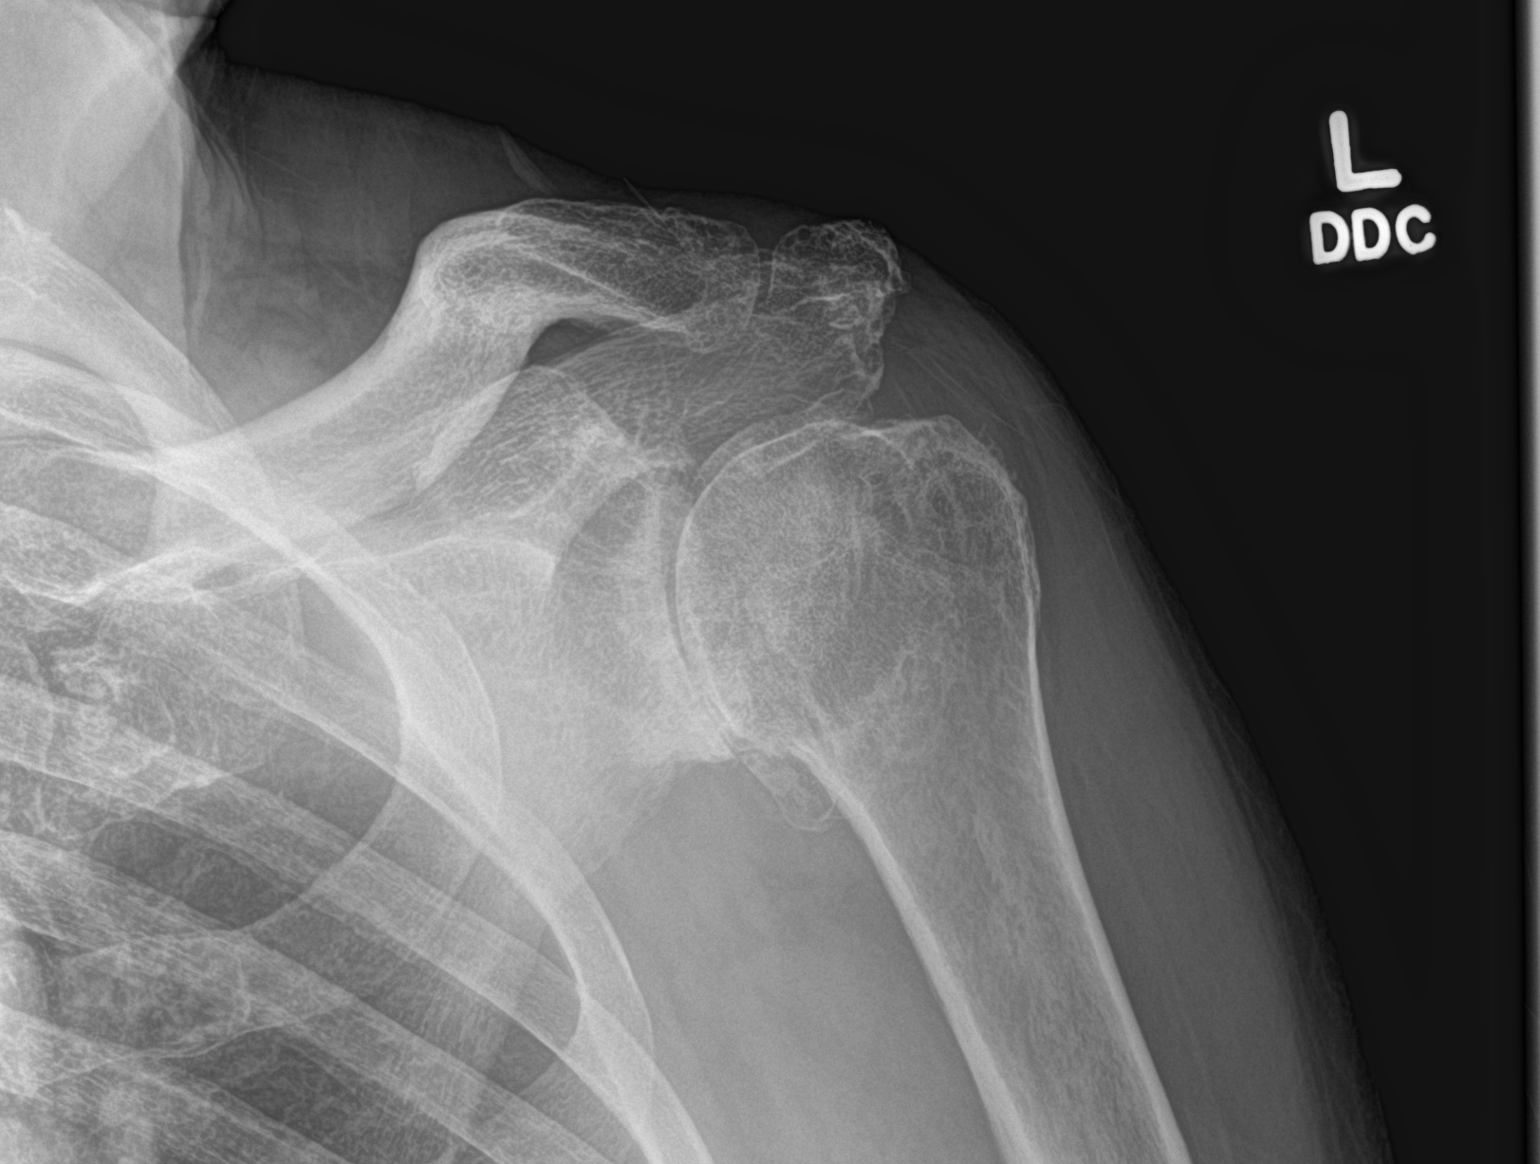
[im 2/3]
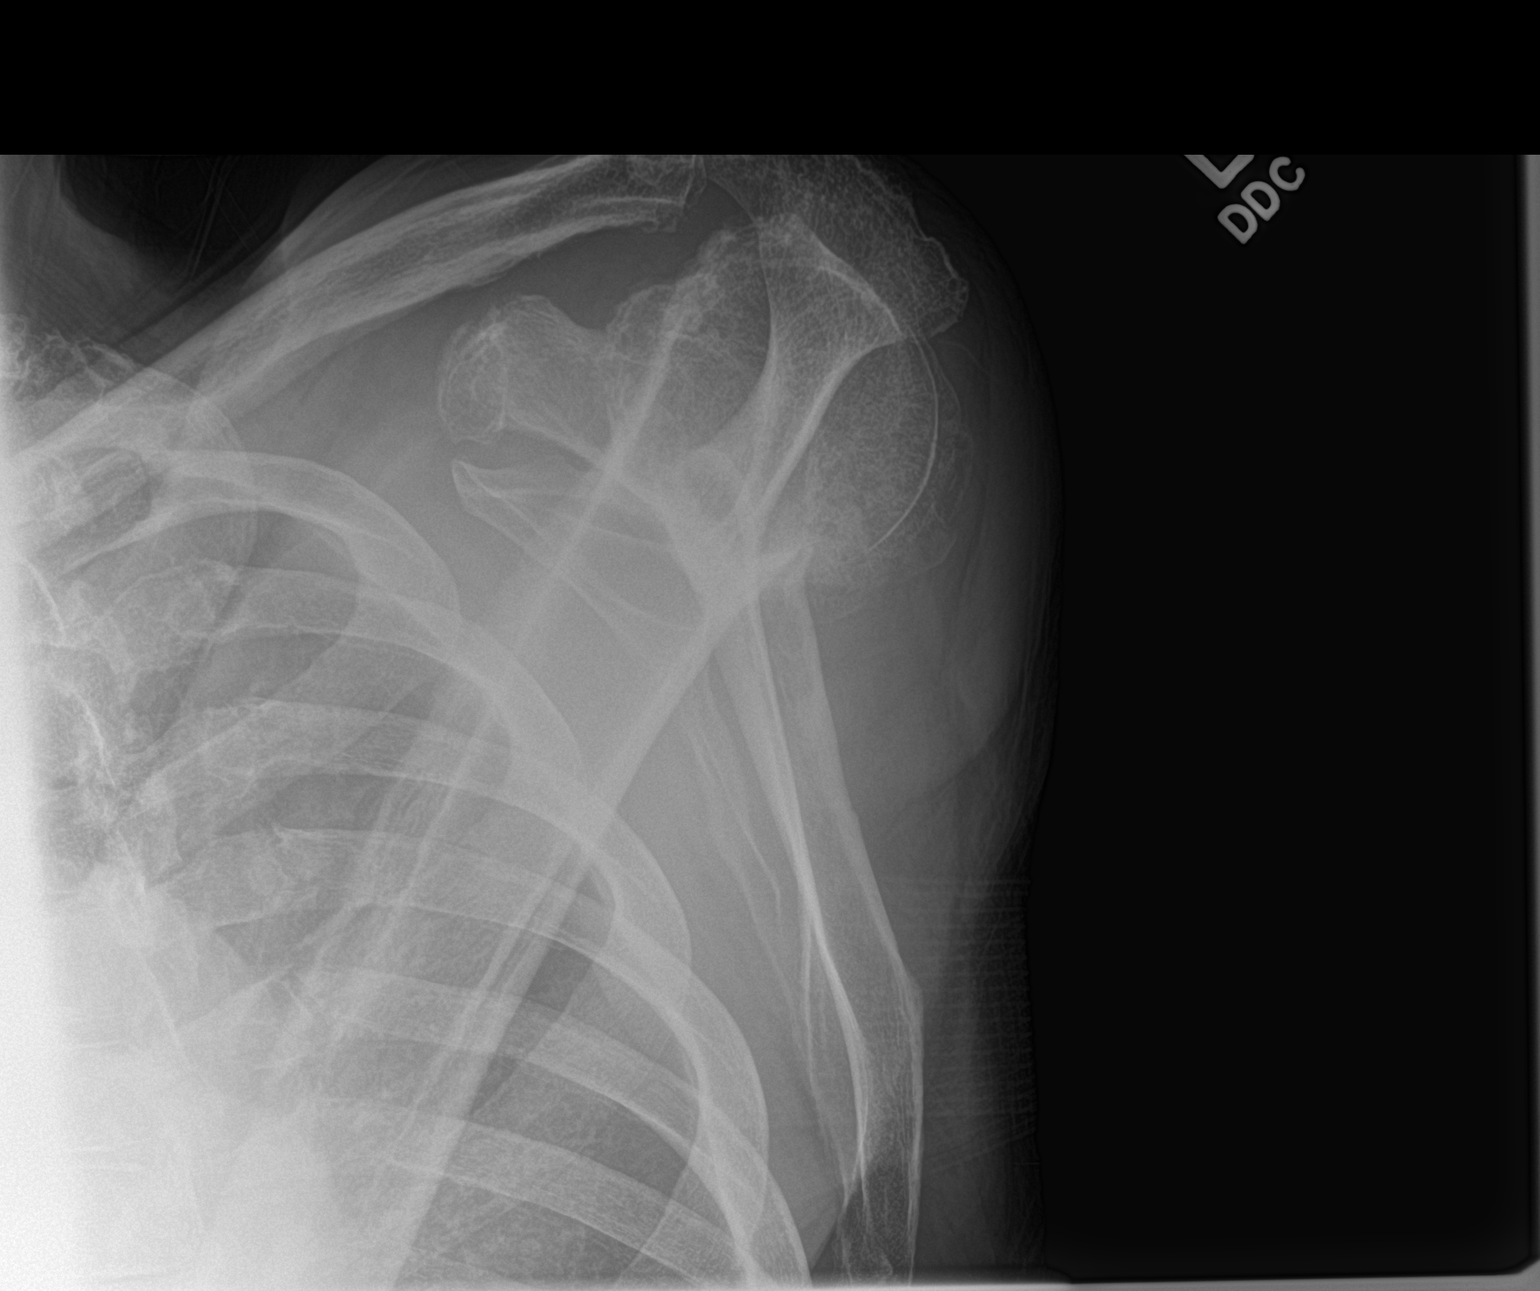
[im 3/3]
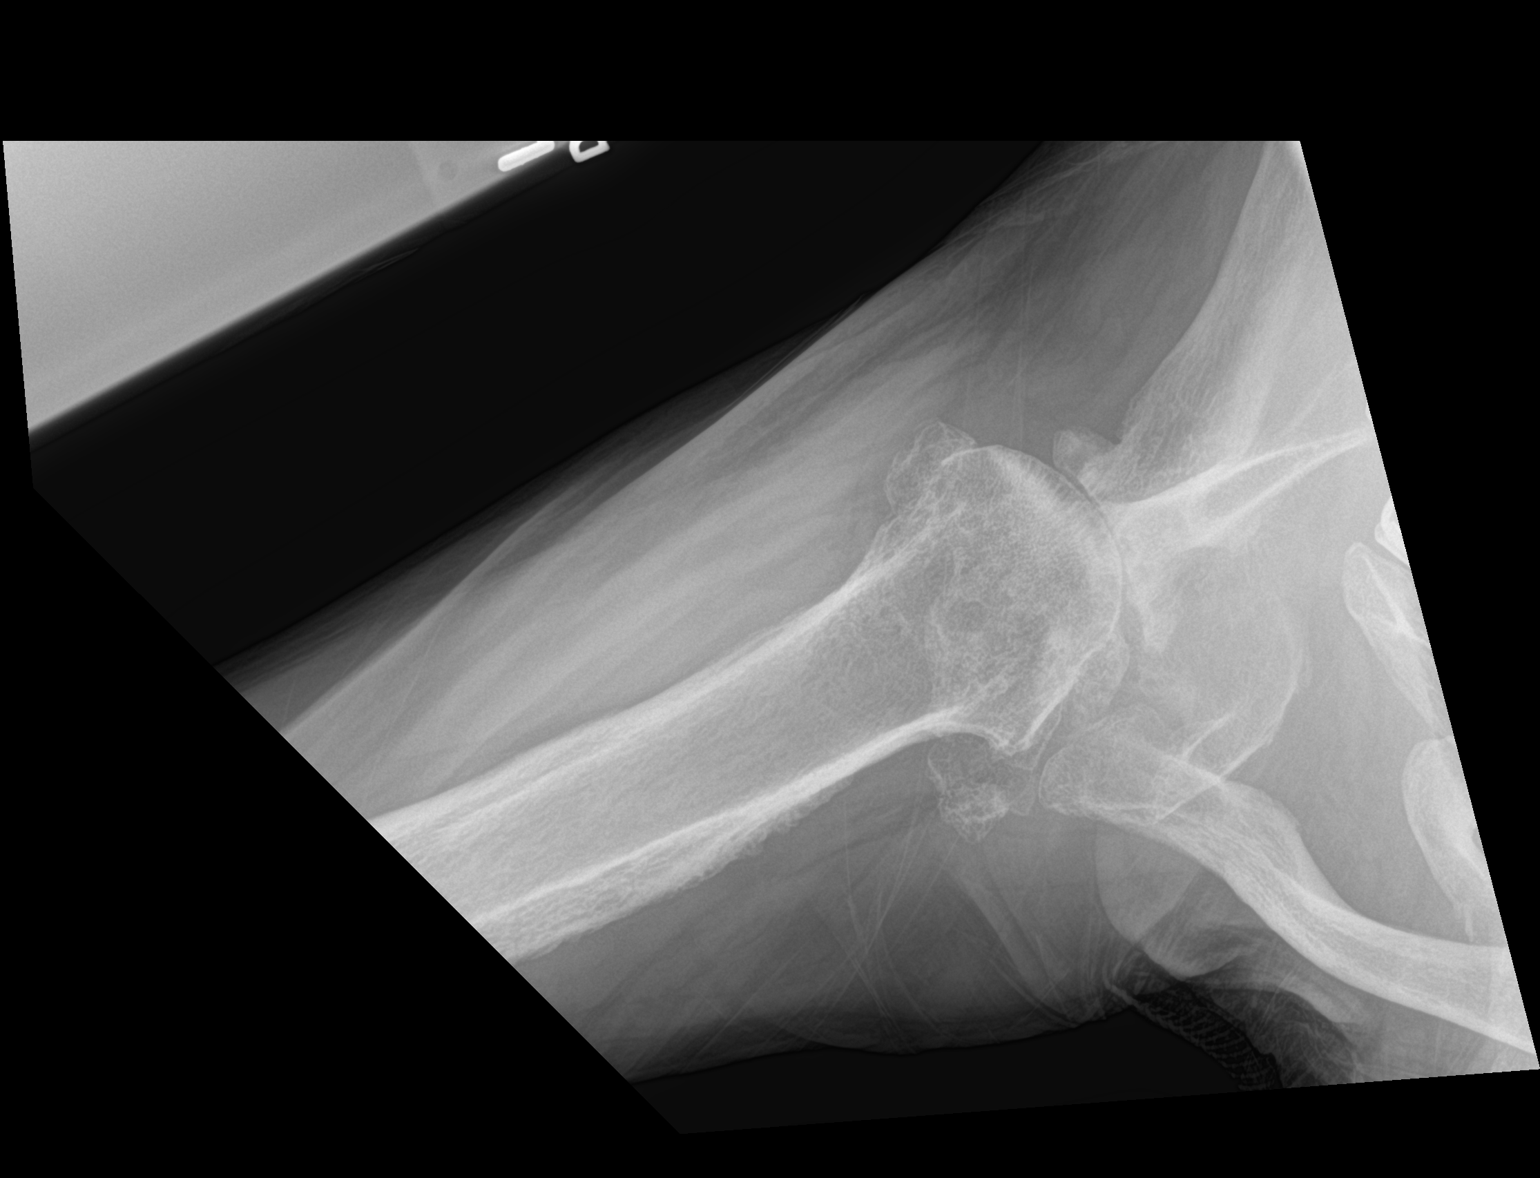

[3 of 3 positions shown; findings below may reference images not displayed]

FINDINGS: Osteopenia.  No displaced fracture.

Advanced degenerative changes of the glenohumeral joint and the
acromioclavicular joint.

Glenohumeral joint appears congruent.
IMPRESSION: Negative for acute bony abnormality.

Advanced degenerative changes of the left shoulder.

## 2018-07-23 ENCOUNTER — Telehealth (INDEPENDENT_AMBULATORY_CARE_PROVIDER_SITE_OTHER): Payer: Medicare HMO | Admitting: Internal Medicine

## 2018-07-23 ENCOUNTER — Encounter: Payer: Self-pay | Admitting: Internal Medicine

## 2018-07-23 ENCOUNTER — Other Ambulatory Visit: Payer: Self-pay

## 2018-07-23 VITALS — BP 170/100 | Ht 72.0 in | Wt 140.0 lb

## 2018-07-23 DIAGNOSIS — I4891 Unspecified atrial fibrillation: Secondary | ICD-10-CM | POA: Diagnosis not present

## 2018-07-23 DIAGNOSIS — Z8673 Personal history of transient ischemic attack (TIA), and cerebral infarction without residual deficits: Secondary | ICD-10-CM

## 2018-07-23 DIAGNOSIS — R0609 Other forms of dyspnea: Secondary | ICD-10-CM

## 2018-07-23 DIAGNOSIS — I10 Essential (primary) hypertension: Secondary | ICD-10-CM

## 2018-07-23 NOTE — Patient Instructions (Signed)
Medication Instructions:  - Your physician recommends that you continue on your current medications as directed. Please refer to the Current Medication list given to you today.  If you need a refill on your cardiac medications before your next appointment, please call your pharmacy.   Lab work: - none ordered  If you have labs (blood work) drawn today and your tests are completely normal, you will receive your results only by: Marland Kitchen MyChart Message (if you have MyChart) OR . A paper copy in the mail If you have any lab test that is abnormal or we need to change your treatment, we will call you to review the results.  Testing/Procedures: - none ordered  Follow-Up: At [facility], you and your health needs are our priority.  As part of our continuing mission to provide you with exceptional heart care, we have created designated Provider Care Teams.  These Care Teams include your primary Cardiologist (physician) and Advanced Practice Providers (APPs -  Physician Assistants and Nurse Practitioners) who all work together to provide you with the care you need, when you need it.  You will need a follow up appointment in 3 months.  Please call our office 2 months in advance to schedule this appointment. (please call in mid- late May to schedule).  You may see Nelva Bush, MD or one of the following Advanced Practice Providers on your designated Care Team:   Murray Hodgkins, NP Christell Faith, PA-C . Marrianne Mood, PA-C  Any Other Special Instructions Will Be Listed Below (If Applicable). - N/A

## 2018-07-23 NOTE — Progress Notes (Signed)
Virtual Visit via Telephone Note   This visit type was conducted due to national recommendations for restrictions regarding the COVID-19 Pandemic (e.g. social distancing) in an effort to limit this patient's exposure and mitigate transmission in our community.  Due to his co-morbid illnesses, this patient is at least at moderate risk for complications without adequate follow up.  This format is felt to be most appropriate for this patient at this time.  The patient did not have access to video technology/had technical difficulties with video requiring transitioning to audio format only (telephone).  All issues noted in this document were discussed and addressed.  No physical exam could be performed with this format.  Please refer to the patient's chart for his  consent to telehealth for [facility].   Evaluation Performed:  Follow-up visit  Date:  07/24/2018   ID:  Paul Schmitt, DOB 28-Aug-1922, MRN [id number]  Patient Location: Home Provider Location: Home  PCP:  Benson Setting, MD  Cardiologist:  Nelva Bush, MD - previously seen by Dr. Ellyn Hack Electrophysiologist:  None   Chief Complaint: Follow-up atrial fibrillation and diastolic dysfunction  History of Present Illness:    Paul Schmitt is a 85 y.o. male with history of stroke (12/2017) with new diagnosis of atrial fibrillation at that time, RBBB, hypertension, diastolic dysfunction, carotid artery stenosis, and tobacco use.  He was previously followed in our office by Dr. Ellyn Hack, having last been seen by Ignacia Bayley, NP, in 07/2016.  At that time, he noted chronic fatigue, that have been present for years.  He did not complain of any further palpitations, having previously been found to have occasional PVCs on EKG.  Low-dose aspirin was restarted, given history of carotid artery disease.  Follow-up carotid Dopplers were recommended but never performed.  Since his last visit with [place], Paul Schmitt has had multiple developments in his  health history.  He was diagnosed with stage IIIb non-small cell lung cancer and underwent palliative radiation at [facility].  He was hospitalized in 12/2017 with dysphasia and weakness, ultimately being diagnosed with an acute stroke in the setting of atrial fibrillation.  He was placed on apixaban, though this was subsequently held due to anemia and cost constraints.  Though it was felt safe to restart anticoagulation, he is currently just on aspirin 81 mg daily due to price of apixaban and rivaroxaban, per his caregiver.  Paul Schmitt reports feeling relatively well.  He is doing some exercise and a little bit of walking.  However, he has been relatively sedentary over the last few months due to his aforementioned illnesses.  His wife also passed away earlier this month.  He has not had any chest pain and reports stable exertional dyspnea and dependent edema.  He notes occasional palpitations.  He has orthopnea and typically sleeps at 45 degrees in his hospital bed.  He has not had any falls or bleeding.  Blood pressure, per his caregiver, is usually 140-160/80-90.  She believes that today's elevated reading of 170/100 is due to the patient having missed his morning medications.  The patient does not have symptoms concerning for COVID-19 infection (fever, chills, cough, or new shortness of breath).    Past Medical History:  Diagnosis Date   Asthma    Carotid arterial disease ([place])    a. 01/2015 CT neck: incidental finding of severe RICA stenosis;  b. 01/2015 Carotid U/S: RICA 60-10%, LICA 93-23%.   Diastolic dysfunction    a. echo 08/2014: EF 60-65%, nl wall motion,  GR1DD, mildly dilated LA, nl PASP   Essential hypertension    Normal coronary arteries    a. cardiac cath 1989: normal coronary arteries; b. 09/2014 nuclear stress test: no significant ischemia or scar, EF 49%.   Palpitations    a. 09/2014 Event Monitor: sinus rhythm, rare pvc's, no notable arrhythmia.   Parotid mass    a. 01/2015 CT  neck: 15x21x29 mm mass in the tail of the right parotid gland.   Prostate cancer ([place])    RBBB    Syncope    Vertigo    Past Surgical History:  Procedure Laterality Date   HERNIA REPAIR       Current Meds  Medication Sig   albuterol (PROVENTIL HFA;VENTOLIN HFA) 108 (90 Base) MCG/ACT inhaler Inhale 2 puffs into the lungs every 6 (six) hours as needed for wheezing or shortness of breath.   amLODipine (NORVASC) 10 MG tablet Take 10 mg by mouth daily.   aspirin EC 81 MG tablet Take 1 tablet (81 mg total) by mouth daily.   atorvastatin (LIPITOR) 40 MG tablet Take 1 tablet (40 mg total) by mouth daily. Please make annual appt for future refills. Thank you   levothyroxine (SYNTHROID, LEVOTHROID) 50 MCG tablet Take 50 mcg by mouth daily before breakfast.   traZODone (DESYREL) 50 MG tablet Take by mouth.   [DISCONTINUED] doxazosin (CARDURA) 2 MG tablet Take 2 mg by mouth at bedtime.   [DISCONTINUED] losartan (COZAAR) 50 MG tablet Take 1 tablet (50 mg total) by mouth daily.   [DISCONTINUED] meclizine (ANTIVERT) 25 MG tablet Take 1 tablet (25 mg total) by mouth 3 (three) times daily as needed for dizziness.   [DISCONTINUED] Melatonin 3 MG CAPS Take 3 mg by mouth daily.   [DISCONTINUED] Multiple Vitamin (MULTIVITAMIN) tablet Take 1 tablet by mouth daily.     Allergies:   Patient has no known allergies.   Social History   Tobacco Use   Smoking status: Current Some Day Smoker    Packs/day: 0.25    Years: 70.00    Pack years: 17.50    Types: Cigarettes    Last attempt to quit: 05/29/2014    Years since quitting: 4.1   Smokeless tobacco: Never Used  Substance Use Topics   Alcohol use: No   Drug use: No     Family Hx: The patient's family history includes Heart attack (age of onset: 24) in his mother; Heart attack (age of onset: 52) in his sister; Heart attack (age of onset: 73) in his brother; Heart disease in his brother, mother, and sister; Hyperlipidemia in his  mother; Hypertension in his mother.  ROS:   Please see the history of present illness.   All other systems reviewed and are negative.   Prior CV studies:   The following studies were reviewed today:  Echocardiogram (01/26/2018, [facility]): Mild to moderate LVH with LVEF 65-70%.  Normal RV systolic function with mild RVH.  Mild TR and right atrial enlargement.  Mild pulmonary hypertension.  Small pericardial effusion.  Labs/Other Tests and Data Reviewed:    EKG:  No ECG reviewed.  Recent Labs: 08/06/2017: ALT 14; Hemoglobin 12.6; Platelets 190 02/14/2018: BUN 15; Creatinine, Ser 1.28; Potassium 3.7; Sodium 138   Recent Lipid Panel Lab Results  Component Value Date/Time   CHOL 189 03/26/2015 09:08 AM   TRIG 55 03/26/2015 09:08 AM   HDL 60 03/26/2015 09:08 AM   CHOLHDL 3.2 03/26/2015 09:08 AM   LDLCALC 118 (H) 03/26/2015 09:08 AM  Wt Readings from Last 3 Encounters:  07/23/18 140 lb (63.5 kg)  08/06/17 150 lb (68 kg)  08/17/16 170 lb 12 oz (77.5 kg)     Objective:    Vital Signs:  BP (!) 170/100 Comment: No BP CUFF   Ht 6' (1.829 m)    Wt 140 lb (63.5 kg)    BMI 18.99 kg/m    VITAL SIGNS:  reviewed  ASSESSMENT & PLAN:    Atrial fibrillation: This was diagnosed at the time of acute CVA in 12/2017 at [facility].  Further details regarding chronicity are uncertain, though Heme/Onc note from January indicates regular rate and rhythm in exam.  Paul Schmitt certainly would benefit from anticoagulation, given CHADSVASC score of at least 6, though in the setting of his advanced lung cancer, anticoagulation may be incongruent with his goals of care.  I will reach out to his PCP to discuss this further.  If apixaban/rivaroxaban are cost prohibitive, warfarin would be an alternative option, though less than ideal in the setting of active cancer.  History of stroke: Most likely cardioembolic in the setting of atrial fibrillation, though carotid artery stenosis was previously noted as well.  We will  manage atrial fibrillation, as above.  Continue statin therapy for secondary prevention.  Chronic dyspnea on exertion: Likely multifactorial but driven predominantly by underlying lung disease/cancer.  No further cardiac work-up at this time, with echo in 01/2018 that you did not see showing preserved LVEF with mild pulmonary hypertension and no significant valvular heart disease.  Hypertension: Blood pressure poorly controlled today, though usually somewhat better when Paul Schmitt takes his medication first thing in the morning.  With a history of stroke, I would certainly like to target a blood pressure of less than 140/90.  I will touch base with his PCP regarding continued management of this, as it sounds like some of his antihypertensive medications were discontinued after his stroke.  COVID-19 Education: The signs and symptoms of COVID-19 were discussed with the patient and how to seek care for testing (follow up with PCP or arrange E-visit).  The importance of social distancing was discussed today.  Time:   Today, I have spent 20 minutes with the patient and his caregiver with telehealth technology discussing the above problems.  An additional 15 minutes were spent reviewing the patient's chart (including outside records from [facility]) and documenting today's encounter.   Medication Adjustments/Labs and Tests Ordered: Current medicines are reviewed at length with the patient today.  Concerns regarding medicines are outlined above.   Tests Ordered: None.  Medication Changes: None.  Disposition:  Follow up in 3 month(s)  Signed, Nelva Bush, MD  07/24/2018 10:37 AM    [facility]

## 2018-07-24 ENCOUNTER — Encounter: Payer: Self-pay | Admitting: Internal Medicine

## 2018-07-24 DIAGNOSIS — Z8673 Personal history of transient ischemic attack (TIA), and cerebral infarction without residual deficits: Secondary | ICD-10-CM | POA: Insufficient documentation

## 2018-07-24 DIAGNOSIS — R0609 Other forms of dyspnea: Secondary | ICD-10-CM | POA: Insufficient documentation

## 2018-07-24 DIAGNOSIS — I4891 Unspecified atrial fibrillation: Secondary | ICD-10-CM | POA: Insufficient documentation

## 2018-09-04 ENCOUNTER — Telehealth: Payer: Self-pay | Admitting: Internal Medicine

## 2018-09-04 ENCOUNTER — Telehealth: Payer: Self-pay

## 2018-09-04 DIAGNOSIS — I4891 Unspecified atrial fibrillation: Secondary | ICD-10-CM

## 2018-09-04 DIAGNOSIS — Z8673 Personal history of transient ischemic attack (TIA), and cerebral infarction without residual deficits: Secondary | ICD-10-CM

## 2018-09-04 MED ORDER — ATORVASTATIN CALCIUM 40 MG PO TABS: 40.0000 mg | ORAL_TABLET | Freq: Every day | ORAL | 0 refills | Status: DC

## 2018-09-04 NOTE — Telephone Encounter (Signed)
I spoke with the patient's caretaker, Alyse Low. She advised that they had spoken with the patient and his PCP had also called and spoken with them about the patient being on warfarin.   His PCP was in favor of this. The patient has consented. He is aware that he will have to come in for periodic finger sticks to keep his levels regulated.   I advised Alyse Low I will forward to Dr. Saunders Revel to let him know and advise on starting dose. She is aware we usually start on 5 mg once daily then check an INR about 5 days after, but will confirm this is what Dr. Saunders Revel would want him to take.  She is aware we will call back with recommendations. RX is to go to [facility].

## 2018-09-04 NOTE — Telephone Encounter (Signed)
Atorvastatin 40 mg sent to [facility].

## 2018-09-04 NOTE — Telephone Encounter (Signed)
Patient caretaker states patient has decided he wants to take Warfarin. Please call to discuss

## 2018-09-05 NOTE — Telephone Encounter (Signed)
[facility],  Can you help with this patient. If you need me to put labs in, etc I can.  Just feel like you may need to be in on this patient from the beginning.   Thanks!

## 2018-09-05 NOTE — Telephone Encounter (Signed)
I spoke with Paul Schmitt caregiver, who agrees that the patient is interested in beginning warfarin.  His last CBC and BMP was in 03/2018 at [facility]  I therefore think it would be best to repeat a CBC, CMP, and baseline INR later this week and then initiate warfarin 2.5 mg daily.  We will then have Paul Schmitt follow-up in our Coumadin clinic 3 to 5 days after initiating warfarin.  Long-term, it would be ideal if we could transition him to home INR monitoring.  We will plan to continue aspirin 81 mg daily until INR has been therapeutic and stable.  Nelva Bush, MD [facility] Pager: [phone]

## 2018-09-05 NOTE — Telephone Encounter (Signed)
Spoke with patient's caregiver. She verbalized understanding for patient to go to the [facility] later today or tomorrow for lab work that is needed. She is aware they will be screened upon entering and will need to wear a mask. Lab orders entered.

## 2018-09-05 NOTE — Telephone Encounter (Signed)
Dr. Saunders Revel has already discussed w/ me - that's why we're going to start him on the lower dose of 2.5 mg daily instead of 5 mg.  [facility] so we just need to check his kidney function, Hgb & HCT before getting him started.   Hopefully he can be set up for labs and have them resulted before the end of the week  - if so, I can see him Wednesday afternoon.

## 2018-09-06 ENCOUNTER — Other Ambulatory Visit
Admission: RE | Admit: 2018-09-06 | Discharge: 2018-09-06 | Disposition: A | Discharge status: Home / Self Care | Payer: Medicare HMO | Source: Ambulatory Visit | Attending: Internal Medicine | Admitting: Internal Medicine

## 2018-09-06 DIAGNOSIS — Z8673 Personal history of transient ischemic attack (TIA), and cerebral infarction without residual deficits: Secondary | ICD-10-CM | POA: Insufficient documentation

## 2018-09-06 DIAGNOSIS — I4891 Unspecified atrial fibrillation: Secondary | ICD-10-CM

## 2018-09-06 LAB — CBC WITH DIFFERENTIAL/PLATELET
Abs Immature Granulocytes: 0.02 10*3/uL (ref 0.00–0.07)
Basophils Absolute: 0 10*3/uL (ref 0.0–0.1)
Basophils Relative: 0 %
Eosinophils Absolute: 0.4 10*3/uL (ref 0.0–0.5)
Eosinophils Relative: 8 %
HCT: 28.8 % — ABNORMAL LOW (ref 39.0–52.0)
Hemoglobin: 9 g/dL — ABNORMAL LOW (ref 13.0–17.0)
Immature Granulocytes: 0 %
Lymphocytes Relative: 13 %
Lymphs Abs: 0.7 10*3/uL (ref 0.7–4.0)
MCH: 26 pg (ref 26.0–34.0)
MCHC: 31.3 g/dL (ref 30.0–36.0)
MCV: 83.2 fL (ref 80.0–100.0)
Monocytes Absolute: 0.6 10*3/uL (ref 0.1–1.0)
Monocytes Relative: 10 %
Neutro Abs: 3.7 10*3/uL (ref 1.7–7.7)
Neutrophils Relative %: 69 %
Platelets: 269 10*3/uL (ref 150–400)
RBC: 3.46 MIL/uL — ABNORMAL LOW (ref 4.22–5.81)
RDW: 15.2 % (ref 11.5–15.5)
WBC: 5.4 10*3/uL (ref 4.0–10.5)
nRBC: 0 % (ref 0.0–0.2)

## 2018-09-06 LAB — BASIC METABOLIC PANEL
Anion gap: 8 (ref 5–15)
BUN: 10 mg/dL (ref 8–23)
CO2: 30 mmol/L (ref 22–32)
Calcium: 8.6 mg/dL — ABNORMAL LOW (ref 8.9–10.3)
Chloride: 97 mmol/L — ABNORMAL LOW (ref 98–111)
Creatinine, Ser: 0.78 mg/dL (ref 0.61–1.24)
GFR calc Af Amer: >60 mL/min (ref >60)
GFR calc non Af Amer: >60 mL/min (ref >60)
Glucose, Bld: 124 mg/dL — ABNORMAL HIGH (ref 70–99)
Potassium: 3.9 mmol/L (ref 3.5–5.1)
Sodium: 135 mmol/L (ref 135–145)

## 2018-09-06 LAB — PROTIME-INR
INR: 1.1 (ref 0.8–1.2)
Prothrombin Time: 14.3 seconds (ref 11.4–15.2)

## 2018-09-07 NOTE — Telephone Encounter (Signed)
Lab results available in EPIC

## 2018-09-07 NOTE — Telephone Encounter (Signed)
Spoke with caregiver, aware not to start warfarin and the decision regarding warfarin will be made next week.

## 2018-09-07 NOTE — Telephone Encounter (Signed)
Lab results reviewed.  Hemoglobin noted to be 9.0, down from 10.4 on last check at [facility] in January.  I will need to discuss this with the patient's PCP next week.  Please advised the patient we will hold off on starting warfarin until his anemia has been addressed.

## 2018-09-10 NOTE — Telephone Encounter (Signed)
Routing to Dr. Darnelle Bos nurse, Lars Pinks.

## 2018-09-11 NOTE — Telephone Encounter (Signed)
Spoke with patient's daughter.  She verbalized understanding for patient to stay on Aspirin and other medications. She will reach out to Dr Maxine Glenn, PCP, if they do not hear anything from her office. Scheduled patient to see Ignacia Bayley, NP in office at the end of July. Patient will need someone to come with him.

## 2018-09-11 NOTE — Telephone Encounter (Signed)
I spoke with Mr. Askren's PCP, Dr. Maxine Glenn regarding his recent labs.  She is also concerned about starting anticoagulation at this time.  We will therefore defer starting warfarin.  Dr. Maxine Glenn will reach out to Mr. Dubeau about further monitoring/workup of his anemia.  For now, he should continue taking aspirin 81 mg daily as well as his other medications.  If he does not already have an appointment to see [place] in the office, we should make arrangements for him to see me or an APP in ~6-8 weeks.  Nelva Bush, MD [facility] Pager: [phone]

## 2018-10-17 ENCOUNTER — Other Ambulatory Visit: Payer: Medicare HMO | Admitting: Primary Care

## 2018-10-17 ENCOUNTER — Other Ambulatory Visit: Payer: Self-pay

## 2018-10-17 DIAGNOSIS — Z515 Encounter for palliative care: Secondary | ICD-10-CM

## 2018-10-17 NOTE — Progress Notes (Signed)
[facility] Consult Note Telephone: [phone]  Fax: [phone]   PATIENT NAME: Paul Schmitt DOB: May 03, 1922 MRN: [id number]  PRIMARY CARE PROVIDER:   Benson Setting, MD [phone]  REFERRING PROVIDER:  Benson Setting, MD [place] [id number] [facility] [place],  [place] [address] [phone]  RESPONSIBLE PARTY:   Extended Emergency Contact Information Primary Emergency Contact: Jolayne Haines Mobile Phone: [phone] Relation: Sister Secondary Emergency Contact: Bonna Gains Mobile Phone: [phone] Relation: Friend  Palliative Care was asked to follow this patient by consultation request of Cene', Crystal, MD. This is a follow up visit.  ASSESSMENT AND RECOMMENDATIONS:   1. Goals of Care: Maximize quality of life and symptom management.  2. Symptom Management:   Dyspnea: Anxiety related, pulse ox is WNL and uses oxygen sporadically.  Seems to be related to his wife's death several months ago.   Function: Has walker, but uses wheel chair mostly. Has not fallen but is alone in the evenings. Encouraged caregiver to provide an emergency call system for patient to use if he falls.  Insomnia : Good some nights uses trazodone 100 mg with good effect. States he wakes up refreshed. Continue nightly trazodone.  3. Family /Caregiver/Community Supports: Has children in area, but his sister in law is POA and has several stable hired care givers. Has made provisions financially to remain in his home, and we did discuss increased care needs which will present at some point. Christy, current PCG, to discuss with POA.  4. Cognitive / Functional decline: Cognitively stable, disease process seems to be stable or slowly progressing  as well with no significant loss of function over past 3 months. He was given 6 month prognosis in Dec 2019. Education provided RE disease advancement eventually and what additional  care needs may arise. Hospice appropriateness note yet clear due to lack of significant functional decline.  5. Advanced Care Directive: MOST created and signed today. Chose DNR, full scope of treatment, antibiotics and  iv fluids if indicated,  feeding tube for a trail period. Send to be uploaded to [facility] as link is not currently active.   6. Follow up Palliative Care Visit: Palliative care will continue to follow for goals of care clarification and symptom management. Return 6-8 weeks or prn.  I spent 60 minutes providing this consultation,  from 1300 to 1400. More than 50% of the time in this consultation was spent coordinating communication.   HISTORY OF PRESENT ILLNESS:  Paul Schmitt is a 85 y.o. year old male with multiple medical problems including Primary lung squamous cell carcinoma, left staging: cT2b, cN3 with palliative radiation in 12/19, hypothyroid, CVA, CAD, COPD, vertigo. Palliative Care was asked to help address goals of care.   CODE STATUS:  DNR, full scope of treatment, antibiotics and  iv fluids if indicated,  feeding tube for a trail period.  PPS: 40% HOSPICE ELIGIBILITY/DIAGNOSIS: TBD  PAST MEDICAL HISTORY:  Past Medical History:  Diagnosis Date  . Asthma   . Carotid arterial disease ([place])    a. 01/2015 CT neck: incidental finding of severe RICA stenosis;  b. 01/2015 Carotid U/S: RICA 33-82%, LICA 50-53%.  . Diastolic dysfunction    a. echo 08/2014: EF 60-65%, nl wall motion, GR1DD, mildly dilated LA, nl PASP  . Essential hypertension   . Normal coronary arteries    a. cardiac cath 1989: normal coronary arteries; b. 09/2014 nuclear stress test: no significant ischemia or scar, EF 49%.  . Palpitations  a. 09/2014 Event Monitor: sinus rhythm, rare pvc's, no notable arrhythmia.  . Parotid mass    a. 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  . Prostate cancer ([place])   . RBBB   . Syncope   . Vertigo     SOCIAL HX:  Social History    Tobacco Use  . Smoking status: Current Some Day Smoker    Packs/day: 0.25    Years: 70.00    Pack years: 17.50    Types: Cigarettes    Last attempt to quit: 05/29/2014    Years since quitting: 4.3  . Smokeless tobacco: Never Used  Substance Use Topics  . Alcohol use: No    ALLERGIES: No Known Allergies   PERTINENT MEDICATIONS:  Outpatient Encounter Medications as of 10/17/2018  Medication Sig  . albuterol (PROVENTIL HFA;VENTOLIN HFA) 108 (90 Base) MCG/ACT inhaler Inhale 2 puffs into the lungs every 6 (six) hours as needed for wheezing or shortness of breath.  Marland Kitchen amLODipine (NORVASC) 10 MG tablet Take 10 mg by mouth daily.  Marland Kitchen aspirin EC 81 MG tablet Take 1 tablet (81 mg total) by mouth daily.  Marland Kitchen atorvastatin (LIPITOR) 40 MG tablet Take 1 tablet (40 mg total) by mouth daily. Please make annual appt for future refills. Thank you  . levothyroxine (SYNTHROID, LEVOTHROID) 50 MCG tablet Take 50 mcg by mouth daily before breakfast.  . traZODone (DESYREL) 50 MG tablet Take by mouth.   No facility-administered encounter medications on file as of 10/17/2018.     PHYSICAL EXAM/ROS:   Current and past weights: 145 lb estimate, 148 lbs 05/02/2018, no recent readings.  Weight 156 lb. 6 months ago. Weight 176 in Dec. 2016.  General: NAD, frail appearing, thin Cardiovascular: no chest pain reported, no edema,  Pulmonary: no cough, no increased SOB Abdomen: appetite good, intake 100%, denies constipation, continent of bowel GU: denies dysuria, continent of urine MSK:  no joint deformities, uses w/c mostly, no falls Skin: no rashes or wounds reported Neurological: Weakness, sleep is good, occ arthritic pain. Denies confusion,no sundowning.  Jason Coop, NP  COVID-19 PATIENT SCREENING TOOL  Person answering questions: _____caregiver Christy_______ _____   1.  Is the patient or any family member in the home showing any signs or symptoms regarding respiratory infection?               Person with Symptom- _______na____________________  a. Fever                                                                          Yes___ No_x__          ___________________  b. Shortness of breath                                                    Yes___ No_x__          ___________________ c. Cough/congestion  Yes___  No_x__         ___________________ d. Body aches/pains                                                         Yes___ No_x__        ____________________ e. Gastrointestinal symptoms (diarrhea, nausea)           Yes___ No_x__        ____________________

## 2018-10-24 ENCOUNTER — Ambulatory Visit: Payer: Medicare HMO | Admitting: Nurse Practitioner

## 2018-11-02 ENCOUNTER — Ambulatory Visit (INDEPENDENT_AMBULATORY_CARE_PROVIDER_SITE_OTHER): Payer: Medicare HMO

## 2018-11-02 ENCOUNTER — Other Ambulatory Visit: Payer: Self-pay

## 2018-11-02 ENCOUNTER — Encounter: Payer: Self-pay | Admitting: Physician Assistant

## 2018-11-02 ENCOUNTER — Other Ambulatory Visit
Admission: RE | Admit: 2018-11-02 | Discharge: 2018-11-02 | Disposition: A | Discharge status: Home / Self Care | Payer: Medicare HMO | Source: Ambulatory Visit | Attending: Physician Assistant | Admitting: Physician Assistant

## 2018-11-02 ENCOUNTER — Ambulatory Visit (INDEPENDENT_AMBULATORY_CARE_PROVIDER_SITE_OTHER): Payer: Medicare HMO | Admitting: Physician Assistant

## 2018-11-02 VITALS — BP 128/78 | HR 117 | Ht 72.0 in | Wt 134.8 lb

## 2018-11-02 DIAGNOSIS — I4891 Unspecified atrial fibrillation: Secondary | ICD-10-CM

## 2018-11-02 DIAGNOSIS — Z8673 Personal history of transient ischemic attack (TIA), and cerebral infarction without residual deficits: Secondary | ICD-10-CM

## 2018-11-02 DIAGNOSIS — I878 Other specified disorders of veins: Secondary | ICD-10-CM | POA: Diagnosis not present

## 2018-11-02 DIAGNOSIS — I10 Essential (primary) hypertension: Secondary | ICD-10-CM

## 2018-11-02 DIAGNOSIS — R0609 Other forms of dyspnea: Secondary | ICD-10-CM

## 2018-11-02 DIAGNOSIS — M7989 Other specified soft tissue disorders: Secondary | ICD-10-CM | POA: Diagnosis not present

## 2018-11-02 LAB — CBC WITH DIFFERENTIAL/PLATELET
Abs Immature Granulocytes: 0.02 10*3/uL (ref 0.00–0.07)
Basophils Absolute: 0 10*3/uL (ref 0.0–0.1)
Basophils Relative: 0 %
Eosinophils Absolute: 0.3 10*3/uL (ref 0.0–0.5)
Eosinophils Relative: 5 %
HCT: 29.4 % — ABNORMAL LOW (ref 39.0–52.0)
Hemoglobin: 9 g/dL — ABNORMAL LOW (ref 13.0–17.0)
Immature Granulocytes: 0 %
Lymphocytes Relative: 8 %
Lymphs Abs: 0.6 10*3/uL — ABNORMAL LOW (ref 0.7–4.0)
MCH: 25.1 pg — ABNORMAL LOW (ref 26.0–34.0)
MCHC: 30.6 g/dL (ref 30.0–36.0)
MCV: 82.1 fL (ref 80.0–100.0)
Monocytes Absolute: 0.6 10*3/uL (ref 0.1–1.0)
Monocytes Relative: 9 %
Neutro Abs: 5.2 10*3/uL (ref 1.7–7.7)
Neutrophils Relative %: 78 %
Platelets: 357 10*3/uL (ref 150–400)
RBC: 3.58 MIL/uL — ABNORMAL LOW (ref 4.22–5.81)
RDW: 17.6 % — ABNORMAL HIGH (ref 11.5–15.5)
WBC: 6.8 10*3/uL (ref 4.0–10.5)
nRBC: 0 % (ref 0.0–0.2)

## 2018-11-02 LAB — BASIC METABOLIC PANEL
Anion gap: 8 (ref 5–15)
BUN: 10 mg/dL (ref 8–23)
CO2: 27 mmol/L (ref 22–32)
Calcium: 8.7 mg/dL — ABNORMAL LOW (ref 8.9–10.3)
Chloride: 99 mmol/L (ref 98–111)
Creatinine, Ser: 0.82 mg/dL (ref 0.61–1.24)
GFR calc Af Amer: >60 mL/min (ref >60)
GFR calc non Af Amer: >60 mL/min (ref >60)
Glucose, Bld: 116 mg/dL — ABNORMAL HIGH (ref 70–99)
Potassium: 3.8 mmol/L (ref 3.5–5.1)
Sodium: 134 mmol/L — ABNORMAL LOW (ref 135–145)

## 2018-11-02 NOTE — Progress Notes (Signed)
Office Visit    Patient Name: Paul Schmitt Date of Encounter: 11/02/2018  Primary Care Provider:  Benson Setting, MD Primary Cardiologist:  Nelva Bush, MD  Chief Complaint    Patient presented today for follow-up of atrial fibrillation and to determine start of anticoagulation based on anemia workup - accompanied by his caregiver.  Past Medical History     Past Medical History:  Diagnosis Date  . Asthma   . Carotid arterial disease ([place])    a. 01/2015 CT neck: incidental finding of severe RICA stenosis;  b. 01/2015 Carotid U/S: RICA 42-68%, LICA 34-19%.  . Diastolic dysfunction    a. echo 08/2014: EF 60-65%, nl wall motion, GR1DD, mildly dilated LA, nl PASP  . Essential hypertension   . Normal coronary arteries    a. cardiac cath 1989: normal coronary arteries; b. 09/2014 nuclear stress test: no significant ischemia or scar, EF 49%.  . Palpitations    a. 09/2014 Event Monitor: sinus rhythm, rare pvc's, no notable arrhythmia.  . Parotid mass    a. 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  . Prostate cancer ([place])   . RBBB   . Syncope   . Vertigo    Past Surgical History:  Procedure Laterality Date  . HERNIA REPAIR      Allergies  No Known Allergies  History of Present Illness    [name] is a 85 year old male with history of stroke (12/2017), atrial fibrillation on low-dose aspirin pending [facility] anemia workup, right bundle branch block, hypertension, diastolic dysfunction, carotid artery stenosis, and tobacco use.  Previously followed by Dr. Ellyn Hack.  Most recently, he has been followed by Dr. Saunders Revel with last visit 07/23/2018. At that time, the patient reported multiple developments in his health history.  He had been diagnosed with stage III non-small cell cancer and had underwent palliative radiation at [facility].  He was also hospitalized 12/2017 secondary to dysphasia and weakness, ultimately being diagnosed with acute stroke in the  setting of his atrial fibrillation. He was initially placed on apixaban; however, this was discontinued due to anemia.  Restart, once deemed safe to do so, had been deferred due to cost constraints.  He therefore remained on aspirin 81 mg daily.  Previously, he had been trying to remain active by doing PT recommended leg exercise and a little bit of walking as tolerated. However, by the time of his follow-up in April 2020, he had become relatively sedentary due to both his illness and as his wife had passed away earlier in the month.  He reported stable exertional dyspnea and stable orthopnea with hospital bed at a 45 degree angle. He reported ongoing dependent edema and occasional symptomatic palpitations.  Per his caregiver, BP 140-160 /80-90.  It was thought that his elevated blood pressure at clinic of 170/100 was due to the patient having missed his morning medications that day.  In June of 2020, patient underwent CBC with results showing a drop in hemoglobin.  Given the patient's need to start anticoagulation for atrial fibrillation, patient's primary cardiologist reached out to PCP and discussed plan to defer anticoagulation until PCP completed further anemia workup/studies. Unfortunately, as of today's 8/7 follow-up visit, no further workup of anemia has been completed. On review of EMR, a 6/24 PCP note documents PCP plan to defer further workup of anemia to cardiology with workup to be done at today's visit.   At today's visit, patient confirmed daily compliance with ASA  81mg  and denied any recent evidence of hematuria, melena, BRBPR, hemoptysis, or hematochezia. No recent falls or sx of pre-syncope.  Per patient, his main complaint since his last visit was of increased right lower extremity edema, stating "I can feel blood clots in my legs."  The patient has known and chronic RLE edema >LLE s/p knee replacement; however, he stressed his feeling that this RLE edema had further increased from baseline  over the last 1 month and in the setting of increased sedentary lifestyle with new associated RLE pain. He denied increased SOB or chest pain. DOE and orthopnea were stable. He noted a dry cough that was reported as chronic. No new neurologic sx concerning for stroke.  He stated he was no longer walking or completing his PT leg exercises; however, he continued to elevate his legs and wear compression stockings without relief of edema or pain. He denied recent travel. No feeling of racing HR or palpitations with EKG today showing atrial fibrillation with RBBB and rapid ventricular rate at 123 bpm (8/7). Repeat EKG, after several minutes of rest, showed ventricular rate of 117 bpm.   Of note, caregiver also expressed concern that patient might have a UTI. She reported reduced appetite /oral intake, reduced energy / activity, noticeable weight loss (estimated at 10 lbs over last month), and increased frequency of urination despite decreased oral intake. Patient denied any discomfort with urination or changes in urine color/clarity or smell.   Home Medications    Prior to Admission medications   Medication Sig Start Date End Date Taking? Authorizing Provider  albuterol (PROVENTIL HFA;VENTOLIN HFA) 108 (90 Base) MCG/ACT inhaler Inhale 2 puffs into the lungs every 6 (six) hours as needed for wheezing or shortness of breath.   Yes [provider]  amLODipine (NORVASC) 10 MG tablet Take 10 mg by mouth daily.   Yes [provider]  aspirin EC 81 MG tablet Take 1 tablet (81 mg total) by mouth daily. 03/26/15  Yes Theora Gianotti, NP  atorvastatin (LIPITOR) 40 MG tablet Take 1 tablet (40 mg total) by mouth daily. Please make annual appt for future refills. Thank you 09/04/18  Yes Theora Gianotti, NP  levothyroxine (SYNTHROID, LEVOTHROID) 50 MCG tablet Take 50 mcg by mouth daily before breakfast.   Yes [provider]  traZODone (DESYREL) 50 MG tablet Take by mouth.  04/05/18  Yes [provider]    Review of Systems    No CP or feelings of racing HR or palpitations. No increased SOB or DOE. Stable orthopnea. No s/sx of bleeding.  All other systems reviewed and are otherwise negative except as noted above.  Physical Exam    VS:  There were no vitals taken for this visit. , BMI There is no height or weight on file to calculate BMI. GEN: Frail, cachetic, elderly male in NAD.  HEENT: normal. Neck: Supple, no JVD Cardiac: Tachycardic, IRIR, soft heart sounds, no murmurs, rubs, or gallops. Radials/DP/PT 2+ and equal bilaterally.  Respiratory: Significantly reduced respiratory effort. No crackles heard on exam. Bilateral reduced breath sounds. GI: Thin, nontender, nondistended, BS + x 4. MS: RLE edema 1+ and greater than nonpitting dependent LLE edema. No palpable cord of RLE. RLE pedal edema noted. No erythema appreciated. Skin: warm and dry, no rash. Neuro:  Strength and sensation are intact. Psych: Normal affect.   Accessory Clinical Findings    ECG personally reviewed by me today - Atrial  Fibrillation with RVR of 123 then 117bpm, known  RBBB, rate increase from previous EKG but otherwise no acute changes.  Assessment & Plan    Atrial Fibrillation with Rapid Ventricular Rate --Diagnosed with acute CVA 12/2017 [facility]). Chronicity of Afib unknown with previous January [facility] exam documenting RRR. --Asymptomatic in atrial fibrillation today- no palpitations or racing HR. No chest pain despite increased ventricular rate.  --Etiology of elevated rates today unknown. Suspect multifactorial with deconditioning / increasing sedentary status and comorbid conditions. Given risk for thrombosis with Afib not on anticoagulation and cancer, RLE venous ultrasound performed today and without evidence of RLE DVT. Cannot completely r/o PE and recommend future workup for if increased SOB or pleuritic CP with ongoing RVR. Infection less likely given asx, stable WBC,  afebrile. Agree with PCP recommendation for TSH recheck. Will defer recheck of TSH to PCP given corresponding management of thyroid medications. Considered also exacerbated HR with albuterol. --Continue amlodipine 10mg  for now. Discussed risks of continued stress on heart if rates remain elevated at rest. Caregiver plans to monitor rates over next few days, and if continued elevated rates, will call for possible transition from amlodipine 10 mg daily to Cardizem 120 mg for more optimal rate control. --Given reduced oral intake and RVR, obtained BMP/CBC today to r/o AKI due to dehydration or electrolyte abnormalities. Lab results unremarkable and stable from 08/2018 labs. --Will continue to defer start of warfarin until anemia workup completed. Patient agreed will reach out to PCP and will call soon with updated plan. CHA2DS2VASc score of at least 7 (HFpEF, HTN, agex2, stroke x2, vascular) and anemia workup needed for full consideration of risks of bleeding v benefits of anticoagulation.   History of stroke --Suspect past CVA as cardioembolic in the setting of Afib, though carotid artery stenosis also previously noted.   --Continue statin therapy for secondary prevention.  --Decision regarding anticoagulation to be determined following anemia workup. Continue ASA 81 mg daily.  Chronic dyspnea on exertion --Likely multifactorial in the setting of underlying anemia, lung disease, and cancer.  No further ischemic work-up indicated at this time with 01/2018 echo showing normal EF and mild pulmonary hypertension without significant valvular disease.   --Given report of increased asymmetrical right lower extremity edema and new R leg pain x1 month (with increased risk for thrombosis), patient completed lower extremity ultrasound today without evidence of DVT. Venous stasis noted as below.   --Increased sedentary lifestyle and deconditioning as well as Afib with RVR are likely contributing to DOE. Recommendation  to increase activity as tolerated.  Venous stasis of lower extremities --LE ultrasound showed venous stasis without DVT. Recommend increase ambulation / leg PT exercises. Continue compression stockings, leg elevation.  Hypertension -Blood pressure improved from previous clinic visit though elevated HR. Continue medical management with amlodipine as above  Tentative plan to transition from amlodipine 10 mg daily to Cardizem 120 mg daily with continued elevated rates and pending update per caregiver over next few days.   Disposition --Follow-up with Dr. Saunders Revel in 1 month.  --CBC and BMET today (results unremarkable when compared with 08/2018). --Defer warfarin / anticoagulation start until anemia workup completed. Patient to call with plan for workup per PCP. --Continue ASA 81mg . --Continue amlodipine. --Call if continued elevated rates with possible transition from amlodipine to Cardizem at that time.  --Defer recheck of TSH to PCP. --RLE venous ultrasound completed and without DVT. Increase activity as tolerated for venous stasis. Continue compression stockings, elevation.  Arvil Chaco, PA-C 11/02/2018, 2:07 PM

## 2018-11-02 NOTE — Patient Instructions (Signed)
Medication Instructions:  - Your physician recommends that you continue on your current medications as directed. Please refer to the Current Medication list given to you today.  If you need a refill on your cardiac medications before your next appointment, please call your pharmacy.   Lab work: - Your physician recommends that you have lab work today: BMP/ CBC  If you have labs (blood work) drawn today and your tests are completely normal, you will receive your results only by: Marland Kitchen MyChart Message (if you have MyChart) OR . A paper copy in the mail If you have any lab test that is abnormal or we need to change your treatment, we will call you to review the results.  Testing/Procedures: - Your physician has requested that you have a lower extremity venous duplex. This test is an ultrasound of the veins in the legs. It looks at venous blood flow that carries blood from the heart to the legs. Allow one hour for a Lower Venous exam. There are no restrictions or special instructions.  Follow-Up: At [facility], you and your health needs are our priority.  As part of our continuing mission to provide you with exceptional heart care, we have created designated Provider Care Teams.  These Care Teams include your primary Cardiologist (physician) and Advanced Practice Providers (APPs -  Physician Assistants and Nurse Practitioners) who all work together to provide you with the care you need, when you need it. . 1 month with Dr. Saunders Revel.  Any Other Special Instructions Will Be Listed Below (If Applicable). - N/A

## 2018-11-14 ENCOUNTER — Telehealth: Payer: Self-pay | Admitting: Internal Medicine

## 2018-11-14 NOTE — Telephone Encounter (Signed)
Returned call to [place], pt caregiver. She reports that pt was advised to put plan on hold to start coumadin, given hx of anemia. Recent labs reviewed at last OV with patient per chart.   Per result note, pt is still on hold regarding blood thinner d/t continued anemia. Per note, pt will receive POC for anemia from PCP.   Advised that pt should call PCP at this point to get more information since they are managing this. She verbalized understanding. Appt confirmed for next month with Dr. Saunders Revel.

## 2018-11-14 NOTE — Telephone Encounter (Signed)
-----   Message from Emily Filbert, RN sent at 11/13/2018  4:33 PM EDT ----- Patient saw Thurmond Butts on 8/7 and needed a 1 month f/u with Dr. Saunders Revel. Please call to schedule.  Thanks!

## 2018-11-14 NOTE — Telephone Encounter (Signed)
error 

## 2018-11-14 NOTE — Telephone Encounter (Signed)
Patient care (crystal moffit)  giver calling in with patient to schedule 1 month follow up but curious to get lab results from 11/02/18. Please advise when able.

## 2018-11-28 ENCOUNTER — Other Ambulatory Visit: Payer: Medicare HMO | Admitting: Primary Care

## 2018-11-28 ENCOUNTER — Other Ambulatory Visit: Payer: Self-pay

## 2018-11-28 DIAGNOSIS — Z515 Encounter for palliative care: Secondary | ICD-10-CM

## 2018-11-28 NOTE — Progress Notes (Signed)
[facility] Palliative Care Consult Note Telephone: [phone]  Fax: [phone]   PATIENT NAME: Paul Schmitt DOB: [birth year] MRN: [id number]  PRIMARY CARE PROVIDER:   Benson Setting, MD, [facility] [id number] [place] [address] [phone]  REFERRING PROVIDER:  Benson Setting, MD [id number] [facility] [place],  [place] [address] [phone]  RESPONSIBLE PARTY:   Extended Emergency Contact Information Primary Emergency Contact: Jolayne Haines Mobile Phone: [phone] Relation: Sister Secondary Emergency Contact: Bonna Gains Mobile Phone: [phone] Relation: Friend   ASSESSMENT AND RECOMMENDATIONS:   1. Advance Care Planning/Goals of Care: Goals include to maximize quality of life and symptom management.Sister in law is HCPOA. Caregiver states he has said he does not want to go back and forth to hospital. We discussed indication for hospice services and that it is likely appropriate. He stated he wanted to be cared for at home and we discussed scope of hospice service.He will need some supervision at hs soon. We discussed his ACP choices. He reiterated he would want to go to the hospital if needed but did say he would follow recommendations for most efficacious care. His SIL is POA and will continue to discuss.  2. Symptom Management:   Weakness: Balance is poor, not ambulating well. No falls. Can pivot transfer. Cannot take steps. Has had sponge baths now b/c he is too weak to maneuver shower.   LE Fluid: Recommend light compression, good foot wear for safety. Recommend help at all times with ambulation. I am concerned about use of loop diuretic for edema due to  dehydration and fall risk .Feet today are 2-3+. They are not painful but he does report they are harder raise to walk.  Intake: Declining, has had 10-15% weight loss since January.Appears more drawn in the face.  Caregiver states he eats one good meal a day.  Depression: Has been widowed x 6 months. Wife had passed at home under hospice. PCG found her passed away. Patient is afraid he will also be found dead. He is staying alone at nights but is starting to be less and less able to care for himself.    3. Family /Caregiver/Community Supports:  Has family in area, ATC care givers, Has 3 children but does not trust them to handle his finances. Sister in law is POA. Needs to expedite some legal work which she's helping with.  4. Cognitive / Functional decline: Sleeping more, eating less and is more fatigued. He is having more trouble with ambulation; a month ago was walking from den to bedroom at night, now goes to bedroom by w/c.   5. Follow up Palliative Care Visit: Palliative care will continue to follow for goals of care clarification and symptom management. Return 2 weeks or prn if not admitted to hospice.  I spent 60 minutes providing this consultation,  from 1430 to 1530. More than 50% of the time in this consultation was spent coordinating communication.   HISTORY OF PRESENT ILLNESS:  Paul Schmitt is a 85 y.o. year old male with multiple medical problems including Primary lung squamous cell carcinoma, left staging: cT2b, cN3 with palliative radiation in 12/19, hypothyroid, CVA, CAD, CHF, COPD, vertigo. Palliative Care was asked to follow this patient by consultation request of Cene', Crystal, MD to help address advance care planning and goals of care. This is a follow up visit.  CODE STATUS: DNR, Full scope, antibiotics, IV fluids and trial feeding tube.  PPS: 30% HOSPICE ELIGIBILITY/DIAGNOSIS: TBD  PAST MEDICAL HISTORY:  Past Medical History:  Diagnosis Date  . Asthma   . Carotid arterial disease ([place])    a. 01/2015 CT neck: incidental finding of severe RICA stenosis;  b. 01/2015 Carotid U/S: RICA 32-35%, LICA 57-32%.  . Diastolic dysfunction    a. echo 08/2014: EF 60-65%, nl wall motion,  GR1DD, mildly dilated LA, nl PASP  . Essential hypertension   . Normal coronary arteries    a. cardiac cath 1989: normal coronary arteries; b. 09/2014 nuclear stress test: no significant ischemia or scar, EF 49%.  . Palpitations    a. 09/2014 Event Monitor: sinus rhythm, rare pvc's, no notable arrhythmia.  . Parotid mass    a. 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  . Prostate cancer ([place])   . RBBB   . Syncope   . Vertigo     SOCIAL HX:  Social History   Tobacco Use  . Smoking status: Current Some Day Smoker    Packs/day: 0.25    Years: 70.00    Pack years: 17.50    Types: Cigarettes    Last attempt to quit: 05/29/2014    Years since quitting: 4.5  . Smokeless tobacco: Never Used  Substance Use Topics  . Alcohol use: No    ALLERGIES: No Known Allergies   PERTINENT MEDICATIONS:  Outpatient Encounter Medications as of 11/28/2018  Medication Sig  . albuterol (PROVENTIL HFA;VENTOLIN HFA) 108 (90 Base) MCG/ACT inhaler Inhale 2 puffs into the lungs every 6 (six) hours as needed for wheezing or shortness of breath.  Marland Kitchen amLODipine (NORVASC) 10 MG tablet Take 10 mg by mouth daily.  Marland Kitchen aspirin EC 81 MG tablet Take 1 tablet (81 mg total) by mouth daily.  Marland Kitchen atorvastatin (LIPITOR) 40 MG tablet Take 1 tablet (40 mg total) by mouth daily. Please make annual appt for future refills. Thank you  . levothyroxine (SYNTHROID, LEVOTHROID) 50 MCG tablet Take 50 mcg by mouth daily before breakfast.  . traZODone (DESYREL) 50 MG tablet Take by mouth.   No facility-administered encounter medications on file as of 11/28/2018.     PHYSICAL EXAM / ROS:   Current and past weights: 134 lbs, Had been 148 in January. 15 lb loss in 9 months. In health he weighed. 10-12% weight loss. General: NAD, frail appearing, thin Cardiovascular: no chest pain reported, 2-3+ LE  edema, cannot use tight hose, I suggested less pressure. Venous stasis of LE new dx.   Pulmonary: + productive  cough, no increased SOB  Abdomen: appetite fair, breakfast is best meal, denies constipation, continent of bowel , Weight loss GU: denies dysuria, continent of urine MSK:  no joint deformities, ambulatory, had been doing exercises but now too fatigued.  Skin: small open area on R ischial area of buttock, 0.5 cm round. Aide is treating. Neurological: Weakness, alert but drowsy,   Jason Coop, NP  COVID-19 PATIENT SCREENING TOOL  Person answering questions: _____Caregiver ______________ _____   1.  Is the patient or any family member in the home showing any signs or symptoms regarding respiratory infection?               Person with Symptom- _______NA____________________  a. Fever  Yes___ No___          ___________________  b. Shortness of breath                                                    Yes___ No___          ___________________ c. Cough/congestion                                       Yes___  No___         ___________________ d. Body aches/pains                                                         Yes___ No___        ____________________ e. Gastrointestinal symptoms (diarrhea, nausea)           Yes___ No___        ____________________  2. Within the past 14 days, has anyone living in the home had any contact with someone with or under investigation for COVID-19?    Yes___ No_X_   Person __________________

## 2018-12-03 ENCOUNTER — Emergency Department (HOSPITAL_COMMUNITY): Payer: Medicare HMO

## 2018-12-03 ENCOUNTER — Other Ambulatory Visit: Payer: Self-pay

## 2018-12-03 ENCOUNTER — Encounter (HOSPITAL_COMMUNITY): Payer: Self-pay | Admitting: Emergency Medicine

## 2018-12-03 ENCOUNTER — Inpatient Hospital Stay (HOSPITAL_COMMUNITY)
Admission: EM | Admit: 2018-12-03 | Discharge: 2018-12-05 | DRG: 101 | Disposition: A | Discharge status: Hospice | Payer: Medicare HMO | Attending: Internal Medicine | Admitting: Internal Medicine

## 2018-12-03 DIAGNOSIS — Z7189 Other specified counseling: Secondary | ICD-10-CM | POA: Diagnosis not present

## 2018-12-03 DIAGNOSIS — I509 Heart failure, unspecified: Secondary | ICD-10-CM | POA: Diagnosis present

## 2018-12-03 DIAGNOSIS — I4891 Unspecified atrial fibrillation: Secondary | ICD-10-CM | POA: Diagnosis present

## 2018-12-03 DIAGNOSIS — J449 Chronic obstructive pulmonary disease, unspecified: Secondary | ICD-10-CM | POA: Diagnosis present

## 2018-12-03 DIAGNOSIS — F17210 Nicotine dependence, cigarettes, uncomplicated: Secondary | ICD-10-CM | POA: Diagnosis present

## 2018-12-03 DIAGNOSIS — C782 Secondary malignant neoplasm of pleura: Secondary | ICD-10-CM | POA: Diagnosis present

## 2018-12-03 DIAGNOSIS — C3490 Malignant neoplasm of unspecified part of unspecified bronchus or lung: Secondary | ICD-10-CM

## 2018-12-03 DIAGNOSIS — E860 Dehydration: Secondary | ICD-10-CM | POA: Diagnosis present

## 2018-12-03 DIAGNOSIS — Z515 Encounter for palliative care: Secondary | ICD-10-CM

## 2018-12-03 DIAGNOSIS — I110 Hypertensive heart disease with heart failure: Secondary | ICD-10-CM | POA: Diagnosis present

## 2018-12-03 DIAGNOSIS — Z66 Do not resuscitate: Secondary | ICD-10-CM | POA: Diagnosis present

## 2018-12-03 DIAGNOSIS — R55 Syncope and collapse: Secondary | ICD-10-CM

## 2018-12-03 DIAGNOSIS — Z8349 Family history of other endocrine, nutritional and metabolic diseases: Secondary | ICD-10-CM

## 2018-12-03 DIAGNOSIS — I700 Atherosclerosis of aorta: Secondary | ICD-10-CM | POA: Diagnosis present

## 2018-12-03 DIAGNOSIS — Z8249 Family history of ischemic heart disease and other diseases of the circulatory system: Secondary | ICD-10-CM | POA: Diagnosis not present

## 2018-12-03 DIAGNOSIS — I2510 Atherosclerotic heart disease of native coronary artery without angina pectoris: Secondary | ICD-10-CM | POA: Diagnosis present

## 2018-12-03 DIAGNOSIS — R918 Other nonspecific abnormal finding of lung field: Secondary | ICD-10-CM

## 2018-12-03 DIAGNOSIS — R569 Unspecified convulsions: Secondary | ICD-10-CM | POA: Diagnosis not present

## 2018-12-03 DIAGNOSIS — R32 Unspecified urinary incontinence: Secondary | ICD-10-CM | POA: Diagnosis present

## 2018-12-03 DIAGNOSIS — E039 Hypothyroidism, unspecified: Secondary | ICD-10-CM | POA: Diagnosis present

## 2018-12-03 DIAGNOSIS — Z8673 Personal history of transient ischemic attack (TIA), and cerebral infarction without residual deficits: Secondary | ICD-10-CM | POA: Diagnosis not present

## 2018-12-03 DIAGNOSIS — R4182 Altered mental status, unspecified: Secondary | ICD-10-CM | POA: Diagnosis present

## 2018-12-03 DIAGNOSIS — J90 Pleural effusion, not elsewhere classified: Secondary | ICD-10-CM

## 2018-12-03 DIAGNOSIS — C3492 Malignant neoplasm of unspecified part of left bronchus or lung: Secondary | ICD-10-CM | POA: Diagnosis present

## 2018-12-03 DIAGNOSIS — Z8546 Personal history of malignant neoplasm of prostate: Secondary | ICD-10-CM | POA: Diagnosis not present

## 2018-12-03 DIAGNOSIS — J9811 Atelectasis: Secondary | ICD-10-CM | POA: Diagnosis present

## 2018-12-03 DIAGNOSIS — K5900 Constipation, unspecified: Secondary | ICD-10-CM | POA: Diagnosis present

## 2018-12-03 DIAGNOSIS — Z79890 Hormone replacement therapy (postmenopausal): Secondary | ICD-10-CM | POA: Diagnosis not present

## 2018-12-03 DIAGNOSIS — Z923 Personal history of irradiation: Secondary | ICD-10-CM | POA: Diagnosis not present

## 2018-12-03 DIAGNOSIS — Z79899 Other long term (current) drug therapy: Secondary | ICD-10-CM | POA: Diagnosis not present

## 2018-12-03 DIAGNOSIS — Z20828 Contact with and (suspected) exposure to other viral communicable diseases: Secondary | ICD-10-CM | POA: Diagnosis present

## 2018-12-03 DIAGNOSIS — R5383 Other fatigue: Secondary | ICD-10-CM | POA: Diagnosis not present

## 2018-12-03 DIAGNOSIS — Z8669 Personal history of other diseases of the nervous system and sense organs: Secondary | ICD-10-CM | POA: Diagnosis not present

## 2018-12-03 LAB — SARS CORONAVIRUS 2 BY RT PCR (HOSPITAL ORDER, PERFORMED IN ~~LOC~~ HOSPITAL LAB): SARS Coronavirus 2: NEGATIVE

## 2018-12-03 LAB — CBC WITH DIFFERENTIAL/PLATELET
Abs Immature Granulocytes: 0.02 10*3/uL (ref 0.00–0.07)
Basophils Absolute: 0 10*3/uL (ref 0.0–0.1)
Basophils Relative: 0 %
Eosinophils Absolute: 0.1 10*3/uL (ref 0.0–0.5)
Eosinophils Relative: 2 %
HCT: 30.9 % — ABNORMAL LOW (ref 39.0–52.0)
Hemoglobin: 9.6 g/dL — ABNORMAL LOW (ref 13.0–17.0)
Immature Granulocytes: 0 %
Lymphocytes Relative: 7 %
Lymphs Abs: 0.4 10*3/uL — ABNORMAL LOW (ref 0.7–4.0)
MCH: 25.1 pg — ABNORMAL LOW (ref 26.0–34.0)
MCHC: 31.1 g/dL (ref 30.0–36.0)
MCV: 80.9 fL (ref 80.0–100.0)
Monocytes Absolute: 0.5 10*3/uL (ref 0.1–1.0)
Monocytes Relative: 7 %
Neutro Abs: 5.6 10*3/uL (ref 1.7–7.7)
Neutrophils Relative %: 84 %
Platelets: 340 10*3/uL (ref 150–400)
RBC: 3.82 MIL/uL — ABNORMAL LOW (ref 4.22–5.81)
RDW: 17.6 % — ABNORMAL HIGH (ref 11.5–15.5)
WBC: 6.7 10*3/uL (ref 4.0–10.5)
nRBC: 0 % (ref 0.0–0.2)

## 2018-12-03 LAB — COMPREHENSIVE METABOLIC PANEL
ALT: 12 U/L (ref 0–44)
AST: 19 U/L (ref 15–41)
Albumin: 2 g/dL — ABNORMAL LOW (ref 3.5–5.0)
Alkaline Phosphatase: 67 U/L (ref 38–126)
Anion gap: 10 (ref 5–15)
BUN: 8 mg/dL (ref 8–23)
CO2: 24 mmol/L (ref 22–32)
Calcium: 8.5 mg/dL — ABNORMAL LOW (ref 8.9–10.3)
Chloride: 97 mmol/L — ABNORMAL LOW (ref 98–111)
Creatinine, Ser: 0.85 mg/dL (ref 0.61–1.24)
GFR calc Af Amer: >60 mL/min (ref >60)
GFR calc non Af Amer: >60 mL/min (ref >60)
Glucose, Bld: 104 mg/dL — ABNORMAL HIGH (ref 70–99)
Potassium: 3.7 mmol/L (ref 3.5–5.1)
Sodium: 131 mmol/L — ABNORMAL LOW (ref 135–145)
Total Bilirubin: 0.6 mg/dL (ref 0.3–1.2)
Total Protein: 7.6 g/dL (ref 6.5–8.1)

## 2018-12-03 LAB — BRAIN NATRIURETIC PEPTIDE: B Natriuretic Peptide: 338 pg/mL — ABNORMAL HIGH (ref 0.0–100.0)

## 2018-12-03 LAB — TROPONIN I (HIGH SENSITIVITY)
Troponin I (High Sensitivity): 323 ng/L (ref <18)
Troponin I (High Sensitivity): 302 ng/L (ref <18)

## 2018-12-03 LAB — URINALYSIS, ROUTINE W REFLEX MICROSCOPIC
Bilirubin Urine: NEGATIVE
Glucose, UA: NEGATIVE mg/dL
Hgb urine dipstick: NEGATIVE
Ketones, ur: 5 mg/dL — AB
Leukocytes,Ua: NEGATIVE
Nitrite: NEGATIVE
Protein, ur: NEGATIVE mg/dL
Specific Gravity, Urine: 1.036 — ABNORMAL HIGH (ref 1.005–1.030)
pH: 6 (ref 5.0–8.0)

## 2018-12-03 MED ORDER — IOHEXOL 350 MG/ML SOLN
75.0000 mL | Freq: Once | INTRAVENOUS | Status: AC | PRN
  Administered 2018-12-03: 17:00:00 100 mL via INTRAVENOUS

## 2018-12-03 MED ORDER — FUROSEMIDE 20 MG PO TABS
20.0000 mg | ORAL_TABLET | Freq: Every day | ORAL | Status: DC
  Administered 2018-12-03 – 2018-12-05 (×3): 20 mg via ORAL
  Filled 2018-12-03 (×3): qty 1

## 2018-12-03 MED ORDER — ACETAMINOPHEN 325 MG PO TABS
650.0000 mg | ORAL_TABLET | Freq: Four times a day (QID) | ORAL | Status: DC | PRN
  Administered 2018-12-04 (×2): 650 mg via ORAL
  Filled 2018-12-03 (×2): qty 2

## 2018-12-03 MED ORDER — ENOXAPARIN SODIUM 40 MG/0.4ML ~~LOC~~ SOLN
40.0000 mg | SUBCUTANEOUS | Status: DC
  Administered 2018-12-04: 40 mg via SUBCUTANEOUS
  Filled 2018-12-03: qty 0.4

## 2018-12-03 MED ORDER — SENNOSIDES-DOCUSATE SODIUM 8.6-50 MG PO TABS: 1.0000 | ORAL_TABLET | Freq: Every evening | ORAL | Status: DC | PRN

## 2018-12-03 MED ORDER — DRONABINOL 2.5 MG PO CAPS
2.5000 mg | ORAL_CAPSULE | Freq: Two times a day (BID) | ORAL | Status: DC
  Administered 2018-12-04 – 2018-12-05 (×3): 2.5 mg via ORAL
  Filled 2018-12-03 (×3): qty 1

## 2018-12-03 MED ORDER — LORATADINE 10 MG PO TABS
10.0000 mg | ORAL_TABLET | Freq: Every day | ORAL | Status: DC
  Administered 2018-12-04 – 2018-12-05 (×2): 10 mg via ORAL
  Filled 2018-12-03 (×2): qty 1

## 2018-12-03 MED ORDER — ALBUTEROL SULFATE (2.5 MG/3ML) 0.083% IN NEBU: 3.0000 mL | INHALATION_SOLUTION | Freq: Four times a day (QID) | RESPIRATORY_TRACT | Status: DC | PRN

## 2018-12-03 MED ORDER — ACETAMINOPHEN 650 MG RE SUPP: 650.0000 mg | Freq: Four times a day (QID) | RECTAL | Status: DC | PRN

## 2018-12-03 MED ORDER — LEVOTHYROXINE SODIUM 50 MCG PO TABS
50.0000 ug | ORAL_TABLET | Freq: Every day | ORAL | Status: DC
  Administered 2018-12-04 – 2018-12-05 (×2): 50 ug via ORAL
  Filled 2018-12-03 (×2): qty 1

## 2018-12-03 MED ORDER — ASPIRIN EC 81 MG PO TBEC
81.0000 mg | DELAYED_RELEASE_TABLET | Freq: Every day | ORAL | Status: DC
  Administered 2018-12-04 – 2018-12-05 (×2): 81 mg via ORAL
  Filled 2018-12-03 (×2): qty 1

## 2018-12-03 MED ORDER — TRAZODONE HCL 50 MG PO TABS
50.0000 mg | ORAL_TABLET | Freq: Once | ORAL | Status: AC
  Administered 2018-12-03: 23:00:00 50 mg via ORAL
  Filled 2018-12-03: qty 1

## 2018-12-03 NOTE — ED Provider Notes (Signed)
[facility]Y DEPARTMENT Provider Note   CSN: [id number] Arrival date & time: 12/03/18  1337     History   Chief Complaint Chief Complaint  Patient presents with   Altered Mental Status   stroke like sx    HPI Paul Schmitt is a 85 y.o. male.     Patient is a 85 year old male with a history of atrial fibrillation, hypertension, diastolic dysfunction, prior stroke and history of syncope who presents today via EMS for altered mental status.  Patient states for the last 1 week he has slowly been feeling gradually worse with generalized weakness, extreme fatigue and decreased appetite.  He states that normally he will get out of bed and use his wheelchair as he does live at home alone but has a caregiver that comes and checks on him.  However for the last week he has not been feeling like getting out of bed.  He has been able to get out of bed until today when he said he just could not get up.  He states after getting up and getting to the bathroom he started feeling very lightheaded like he might pass out and then reports were patient had a syncopal event.  He was unresponsive with normal vital signs when EMS arrived and then became responsive in route and is at baseline.  Patient states he always has some weakness in the right side after her stroke but it feels about the same.  He is also noted new swelling in his bilateral lower extremities over the last 1 week.  He is also had diarrhea and cough.  No fever that he is aware of and he does note slight shortness of breath when he tries to do something.  He denies any recent medication changes and he has had no falls or trauma.  He feels like he has been urinating okay but again had decreased p.o. intake.  He denies any chest pain, abdominal pain.  The history is provided by the patient and the EMS personnel.  Altered Mental Status   Past Medical History:  Diagnosis Date   Asthma    Carotid arterial disease ([place])     a. 01/2015 CT neck: incidental finding of severe RICA stenosis;  b. 01/2015 Carotid U/S: RICA 10-17%, LICA 51-02%.   Diastolic dysfunction    a. echo 08/2014: EF 60-65%, nl wall motion, GR1DD, mildly dilated LA, nl PASP   Essential hypertension    Normal coronary arteries    a. cardiac cath 1989: normal coronary arteries; b. 09/2014 nuclear stress test: no significant ischemia or scar, EF 49%.   Palpitations    a. 09/2014 Event Monitor: sinus rhythm, rare pvc's, no notable arrhythmia.   Parotid mass    a. 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   Prostate cancer [facility])    RBBB    Syncope    Vertigo     Patient Active Problem List   Diagnosis Date Noted   Venous stasis 11/02/2018   Atrial fibrillation ([place]) 07/24/2018   History of stroke 07/24/2018   Dyspnea on exertion 07/24/2018   Palpitations    Carotid arterial disease ([place])    Uncontrolled hypertension    Vertigo    Diastolic dysfunction    Syncope    Intermittent palpitations 08/26/2014   Chest tightness 08/26/2014   Fatigue - with exercise intolerance 08/26/2014   Bradycardia 08/26/2014    Past Surgical History:  Procedure Laterality Date  HERNIA REPAIR          Home Medications    Prior to Admission medications   Medication Sig Start Date End Date Taking? Authorizing Provider  albuterol (PROVENTIL HFA;VENTOLIN HFA) 108 (90 Base) MCG/ACT inhaler Inhale 2 puffs into the lungs every 6 (six) hours as needed for wheezing or shortness of breath.    [provider]  amLODipine (NORVASC) 10 MG tablet Take 10 mg by mouth daily.    [provider]  aspirin EC 81 MG tablet Take 1 tablet (81 mg total) by mouth daily. 03/26/15   Theora Gianotti, NP  atorvastatin (LIPITOR) 40 MG tablet Take 1 tablet (40 mg total) by mouth daily. Please make annual appt for future refills. Thank you 09/04/18   Theora Gianotti, NP  doxazosin  (CARDURA) 2 MG tablet Take 2 mg by mouth daily.    [provider]  dronabinol (MARINOL) 2.5 MG capsule Take 2.5 mg by mouth 2 (two) times daily before a meal.    [provider]  furosemide (LASIX) 20 MG tablet Take 20 mg by mouth daily.    [provider]  levothyroxine (SYNTHROID, LEVOTHROID) 50 MCG tablet Take 50 mcg by mouth daily before breakfast.    [provider]  loratadine (CLARITIN) 10 MG tablet Take 10 mg by mouth daily.    [provider]  traZODone (DESYREL) 50 MG tablet Take 50 mg by mouth.  04/05/18   [provider]    Family History Family History  Problem Relation Age of Onset   Heart disease Mother    Heart attack Mother 8   Hyperlipidemia Mother    Hypertension Mother    Heart disease Sister    Heart attack Sister 66   Heart disease Brother    Heart attack Brother 13    Social History Social History   Tobacco Use   Smoking status: Current Some Day Smoker    Packs/day: 0.25    Years: 70.00    Pack years: 17.50    Types: Cigarettes    Last attempt to quit: 05/29/2014    Years since quitting: 4.5   Smokeless tobacco: Never Used  Substance Use Topics   Alcohol use: No   Drug use: No     Allergies   Patient has no known allergies.   Review of Systems Review of Systems  All other systems reviewed and are negative.    Physical Exam Updated Vital Signs BP (!) 118/92    Resp 19    Ht 6' (1.829 m)    Wt 61.2 kg    BMI 18.31 kg/m   Physical Exam Vitals signs and nursing note reviewed.  Constitutional:      General: He is not in acute distress.    Appearance: Normal appearance. He is well-developed and normal weight.     Comments: Frail elderly male  HENT:     Head: Normocephalic and atraumatic.     Mouth/Throat:     Mouth: Mucous membranes are moist.  Eyes:     Conjunctiva/sclera: Conjunctivae normal.     Pupils: Pupils are equal, round, and reactive to light.  Neck:      Musculoskeletal: Normal range of motion and neck supple.  Cardiovascular:     Rate and Rhythm: Tachycardia present. Rhythm irregularly irregular.     Heart sounds: No murmur.  Pulmonary:     Effort: Pulmonary effort is normal. No respiratory distress.     Breath sounds: Normal breath  sounds. No wheezing or rales.  Abdominal:     General: There is no distension.     Palpations: Abdomen is soft.     Tenderness: There is no abdominal tenderness. There is no guarding or rebound.  Musculoskeletal: Normal range of motion.        General: No tenderness.     Right lower leg: Edema present.     Left lower leg: Edema present.     Comments: 2+ pitting edema in bilateral ankles  Skin:    General: Skin is warm and dry.     Capillary Refill: Capillary refill takes less than 2 seconds.     Coloration: Skin is pale.     Findings: No erythema or rash.  Neurological:     Mental Status: He is alert and oriented to person, place, and time. Mental status is at baseline.     Comments: Mild pronator drift of the left upper extremity and patient slightly leans to the left but is moving both arms without difficulty.  Bilateral legs inability to pick up off the bed which he states is more chronic.  No aphasia or notable facial droop.  Psychiatric:        Mood and Affect: Mood normal.        Behavior: Behavior normal.        Thought Content: Thought content normal.      ED Treatments / Results  Labs (all labs ordered are listed, but only abnormal results are displayed) Labs Reviewed  CBC WITH DIFFERENTIAL/PLATELET - Abnormal; Notable for the following components:      Result Value   RBC 3.82 (*)    Hemoglobin 9.6 (*)    HCT 30.9 (*)    MCH 25.1 (*)    RDW 17.6 (*)    Lymphs Abs 0.4 (*)    All other components within normal limits  COMPREHENSIVE METABOLIC PANEL - Abnormal; Notable for the following components:   Sodium 131 (*)    Chloride 97 (*)    Glucose, Bld 104 (*)    Calcium 8.5 (*)     Albumin 2.0 (*)    All other components within normal limits  BRAIN NATRIURETIC PEPTIDE - Abnormal; Notable for the following components:   B Natriuretic Peptide 338.0 (*)    All other components within normal limits  TROPONIN I (HIGH SENSITIVITY) - Abnormal; Notable for the following components:   Troponin I (High Sensitivity) 323 (*)    All other components within normal limits  URINE CULTURE  SARS CORONAVIRUS 2 (HOSPITAL ORDER, [facility])  URINALYSIS, ROUTINE W REFLEX MICROSCOPIC    EKG EKG Interpretation  Date/Time:  Monday December 03 2018 13:50:20 EDT Ventricular Rate:  114 PR Interval:    QRS Duration: 138 QT Interval:  359 QTC Calculation: 495 R Axis:   108 Text Interpretation:  Atrial fibrillation Right bundle branch block Confirmed by Blanchie Dessert [phone]) on 12/03/2018 1:58:13 PM   Radiology Dg Chest Port 1 View  Result Date: 12/03/2018 CLINICAL DATA:  pt went unresponsive and had stroke like symptoms, reporting left sided lean and gaze. Denies chest pain EXAM: PORTABLE CHEST 1 VIEW COMPARISON:  12/21/2017 FINDINGS: There is new, complete opacification of LEFT hemithorax. Findings are consistent with lung consolidation or atelectasis and/or pleural effusion. The heart contours are obscured by opacity. There is mild interstitial edema in the RIGHT lung. Degenerative changes are seen in both shoulders. IMPRESSION: New, complete opacification of the LEFT hemithorax. Recommend further  evaluation CT of the chest. Intravenous contrast is recommended unless contraindicated. Electronically Signed   By: Nolon Nations M.D.   On: 12/03/2018 15:12    Procedures Procedures (including critical care time)  Medications Ordered in ED Medications - No data to display   Initial Impression / Assessment and Plan / ED Course  I have reviewed the triage vital signs and the nursing notes.  Pertinent labs & imaging results that were available during my  care of the patient were reviewed by me and considered in my medical decision making (see chart for details).        Patient is a 85 year old elderly male with multiple medical problems presenting today after a syncopal event at home.  Patient is not displaying strokelike symptoms and states he has some chronic deficits after having strokes but no specific strokelike symptoms now.  It sounds as if patient had a syncopal event.  He went to the bathroom and when coming back started feeling lightheaded like he was going to pass out.  For the last 1 week he has had worsening symptoms of extreme fatigue, leg swelling, shortness of breath, cough and diarrhea.  Concern for electrolyte of mass abnormality versus infection versus pneumonia versus CHF versus acute kidney injury versus PE as patient is fairly sedentary.  Patient is in atrial fibrillation today which he is a history of per the patient.  Heart rate is between low 100s to 120s.  He does not think he is taking anticoagulation at this time. Labs and imaging pending.  3:20 PM CBC within normal white count and stable hemoglobin of 9.6, CMP with mild hyponatremia of 131 but otherwise normal creatinine, BNP mildly elevated at 300 and troponin elevated at 323.  Patient's EKG shows atrial fibrillation and a right bundle branch block but no concerning findings for ACS at this time.  Patient is also denying any chest pain.  Patient's chest x-ray shows a new complete opacification of the left lung.  He does have a prior history of lung cancer.  We will do a CT to further evaluate and also rule out for PE.  At this time patient is not requiring oxygen.  O2 sats are 99% but does have mild tachypnea and low 20s.  Final Clinical Impressions(s) / ED Diagnoses   Final diagnoses:  None    ED Discharge Orders    None       Blanchie Dessert, MD 12/03/18 2117

## 2018-12-03 NOTE — ED Provider Notes (Addendum)
Patient signed out from Dr. Maryan Rued.  85 year old male who lives at home history of lung cancer A. fib.  Here with decreased energy for a week and an episode of syncope today.  He has a white out on his lung. Physical Exam  BP 114/79   Pulse 90   Temp 98.5 F (36.9 C) (Oral)   Resp (!) 23   Ht 6' (1.829 m)   Wt 61.2 kg   SpO2 100%   BMI 18.31 kg/m   Physical Exam  ED Course/Procedures     Procedures  MDM  Plan is to follow-up on chest CT.  Will need admission to the hospital for further management.  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  I spoke with the patient's daughter who is his healthcare proxy.  We discussed that he is involved with palliative but has not fully been accepted by hospice yet.  He may benefit from admission just until we can get these other arrangements and find out if there is something temporizing we could do to keep him comfortable.  She is in agreement with that and does not want him to be transferred to [facility] where he primarily gets his care.  She says she is local and it would be a lot easier for her to manage this from here.  Placed in a call into unassigned for admission.   Daughter confirms the patient is DNR/DNI.  I discussed with internal medicine team who will evaluate him for admission.    Hayden Rasmussen, MD 12/03/18 1748    Hayden Rasmussen, MD 12/03/18 2207

## 2018-12-03 NOTE — Progress Notes (Signed)
Pt arrived to [facility]. Alert and oriented x 4, identified appropriately. VS stable. Cardiac monitor in place and CCMD notified. Pt has no belongings at the bedside. Oriented to room and equipment, instructed on how to use call light for assistance and call light left within reach.  Will continue to monitor and treat pt per MD orders.

## 2018-12-03 NOTE — ED Notes (Signed)
Called floor, room still not cleaned

## 2018-12-03 NOTE — ED Notes (Signed)
Attempted report 

## 2018-12-03 NOTE — H&P (Addendum)
Date: 12/03/2018               Patient Name:  Paul Schmitt MRN: [id number]  DOB: July 21, 1922 Age / Sex: 85 y.o., male   PCP: Benson Setting, MD         Medical Service: Internal Medicine Teaching Service         Attending Physician: Dr. Sid Falcon, MD    First Contact: Dr. Al Decant Pager: [phone]  Second Contact: Dr. Lonia Skinner Pager: [phone]       After Hours (After 5p/  First Contact Pager: [phone]  weekends / holidays): Second Contact Pager: [phone]   Chief Complaint: syncope   History of Present Illness: Mr. Pizzuto is a 85 yo M w/ a left sided lung squamous cell carcinoma (cT2b, cN2 s/p palliative radiation in 12/19), CAD, CVA, CHF and COPD who presents from home after a syncopal episode in the context of a week of worsening weakness, fatigue and decreased po intake. His daughter reports that he was sitting on the bedside commode and began to stand up when his body became tense, he fell to the floor and started shaking with eyes wide open. After the shaking stopped he was unresponsive and incoherent. She does report urinary incontinence but not fecal incontinence. She reports a hx of syncope which was worked up to be secondary to dehydration. 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 The pt denies fevers, chills, rhinorrhea, chest pain, SOB, abdominal pain, N/V, diarrhea and headache. He reports a chronic cough.   Meds:  No outpatient medications have been marked as taking for the 12/03/18 encounter [facility] Encounter).   albuterol (PROVENTIL HFA;VENTOLIN HFA) 108 (90 Base) MCG/ACT inhaler Inhale 2 puffs into the lungs every 6 (six) hours as needed for wheezing or shortness of breath.  amLODipine (NORVASC) 10 MG tablet Take 10 mg by mouth daily.  aspirin EC 81 MG tablet Take 1 tablet (81 mg total) by mouth daily.  atorvastatin (LIPITOR) 40 MG tablet Take 1 tablet (40 mg total) by mouth  daily. Please make annual appt for future refills. Thank you  levothyroxine (SYNTHROID, LEVOTHROID) 50 MCG tablet Take 50 mcg by mouth daily before breakfast.  traZODone (DESYREL) 50 MG tablet Take by mouth     Allergies: Allergies as of 12/03/2018  . (No Known Allergies)   Past Medical History:  Diagnosis Date  . Asthma   . Carotid arterial disease ([place])    a. 01/2015 CT neck: incidental finding of severe RICA stenosis;  b. 01/2015 Carotid U/S: RICA 91-63%, LICA 84-66%.  . Diastolic dysfunction    a. echo 08/2014: EF 60-65%, nl wall motion, GR1DD, mildly dilated LA, nl PASP  . Essential hypertension   . Normal coronary arteries    a. cardiac cath 1989: normal coronary arteries; b. 09/2014 nuclear stress test: no significant ischemia or scar, EF 49%.  . Palpitations    a. 09/2014 Event Monitor: sinus rhythm, rare pvc's, no notable arrhythmia.  . Parotid mass    a. 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  . Prostate cancer ([place])   . RBBB   . Syncope   . Vertigo     Family History:  Mother: heart disease Sister: heart disease Brother: heart disease  Social History: lives at home alone but has care takers come check on him, previous smoker quit  around a year ago  Review of Systems: A complete ROS was negative except as per HPI.   Physical Exam: Blood pressure 121/90, pulse (!) 107, temperature 98.5 F (36.9 C), temperature source Oral, resp. rate 14, height 6' (1.829 m), weight 61.2 kg, SpO2 100 %.  Physical Exam  Constitutional: He is oriented to person, place, and time.  Thin, chronically ill appearing male laying comfortably in bed  Cardiovascular:  Tachy, irregularly irregular rhythm; lower extremity pitting edema   Pulmonary/Chest:  Decreased breath sounds on the left side with prominent wheezing   Abdominal: Soft. Bowel sounds are normal. He exhibits no distension. There is no abdominal tenderness.  Neurological: He is alert and  oriented to person, place, and time.  Skin: Skin is warm and dry.  Nursing note and vitals reviewed.   Labs:  Results for orders placed or performed during the hospital encounter of 12/03/18 (from the past 24 hour(s))  CBC with Differential/Platelet     Status: Abnormal   Collection Time: 12/03/18  2:09 PM  Result Value Ref Range   WBC 6.7 4.0 - 10.5 K/uL   RBC 3.82 (L) 4.22 - 5.81 MIL/uL   Hemoglobin 9.6 (L) 13.0 - 17.0 g/dL   HCT 30.9 (L) 39.0 - 52.0 %   MCV 80.9 80.0 - 100.0 fL   MCH 25.1 (L) 26.0 - 34.0 pg   MCHC 31.1 30.0 - 36.0 g/dL   RDW 17.6 (H) 11.5 - 15.5 %   Platelets 340 150 - 400 K/uL   nRBC 0.0 0.0 - 0.2 %   Neutrophils Relative % 84 %   Neutro Abs 5.6 1.7 - 7.7 K/uL   Lymphocytes Relative 7 %   Lymphs Abs 0.4 (L) 0.7 - 4.0 K/uL   Monocytes Relative 7 %   Monocytes Absolute 0.5 0.1 - 1.0 K/uL   Eosinophils Relative 2 %   Eosinophils Absolute 0.1 0.0 - 0.5 K/uL   Basophils Relative 0 %   Basophils Absolute 0.0 0.0 - 0.1 K/uL   Immature Granulocytes 0 %   Abs Immature Granulocytes 0.02 0.00 - 0.07 K/uL  Comprehensive metabolic panel     Status: Abnormal   Collection Time: 12/03/18  2:09 PM  Result Value Ref Range   Sodium 131 (L) 135 - 145 mmol/L   Potassium 3.7 3.5 - 5.1 mmol/L   Chloride 97 (L) 98 - 111 mmol/L   CO2 24 22 - 32 mmol/L   Glucose, Bld 104 (H) 70 - 99 mg/dL   BUN 8 8 - 23 mg/dL   Creatinine, Ser 0.85 0.61 - 1.24 mg/dL   Calcium 8.5 (L) 8.9 - 10.3 mg/dL   Total Protein 7.6 6.5 - 8.1 g/dL   Albumin 2.0 (L) 3.5 - 5.0 g/dL   AST 19 15 - 41 U/L   ALT 12 0 - 44 U/L   Alkaline Phosphatase 67 38 - 126 U/L   Total Bilirubin 0.6 0.3 - 1.2 mg/dL   GFR calc non Af Amer >60 >60 mL/min   GFR calc Af Amer >60 >60 mL/min   Anion gap 10 5 - 15  Brain natriuretic peptide     Status: Abnormal   Collection Time: 12/03/18  2:09 PM  Result Value Ref Range   B Natriuretic Peptide 338.0 (H) 0.0 - 100.0 pg/mL  Troponin I (High Sensitivity)     Status:  Abnormal   Collection Time: 12/03/18  2:09 PM  Result Value Ref Range   Troponin I (High Sensitivity) 323 (HH) <  18 ng/L  SARS Coronavirus 2 [facility] order, Performed in [facility] hospital lab) Nasopharyngeal Nasopharyngeal Swab     Status: None   Collection Time: 12/03/18  2:09 PM   Specimen: Nasopharyngeal Swab  Result Value Ref Range   SARS Coronavirus 2 NEGATIVE NEGATIVE  Troponin I (High Sensitivity)     Status: Abnormal   Collection Time: 12/03/18  5:25 PM  Result Value Ref Range   Troponin I (High Sensitivity) 302 (HH) <18 ng/L    EKG: personally reviewed my interpretation is afib w/ RBBB without ischemic changes.  CXR: personally reviewed my interpretation is new, complete opacification of the LEFT hemithorax  CTA Chest: no PE, complete atelectasis of the LEFT lung w/ a suspected medial LEFT upper lobe mass invading the mediastinum, narrowing of central pulmonary arteries in LEFT lung suggesting presence of hilar adenopathy; enlarged azygo esophageal recess node; large LEFT pleural effusion w/ multiple pleural-based nodules and diffuse pleural thickening consistent w/ pleural carcinomatosis; two hypervascular foci w/in the liver question vascular phenomenon versus hypervascular metastases  Assessment:  Mr. 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 Seizure with an elevated troponin w/ unremarkable EKG concerning for NSTEMI.  Plan by Problem: Active Problems:   Fall/Syncopal episode  -pt presents with after a supposed syncopal episode upon rising from the bedside commode; he became rigid, started shaking and was incontinent of urine and was unresponsive and incoherent following the episode; pt w/ hx of syncopal events secondary to dehydration and patient had reported poor po intake for the last  week; however, the story is concerning for a seizure and daughter states he has a hx of untreated seizures 5 years prior; this could also be a seizure from a brain metastases from his lung cancer; the syncopal episode could also be secondary to an ACS as patient's troponin was elevated to 302 on admission and is currently uptrending, most recently 323. -There are numerous ways to evaluate the cause of this episode but given the patient is transitioning to hospice and the daughter would only like symptomatically helpful interventions performed, our options are limited -cards consulted and doesn't recommend intervening in this patient at this time -no head CT or EEG at this time, reevaluate in the morning with daughter -palliative consult to assist daughter in transitioning patient to hospice    Stage 4 Lung Cancer/Pleural carcinomatosis/Complete atelectasis of LEFT lung -patient has known stage 4 lung cancer w/ chronic cough but no complaints of SOB -imaging in ED demonstrated complete atelectasis of the left lung and a large left pleural effusion with multiple pleural-based nodules and pleural thickening consistent w/ pleural carcinomatosis -possible interventions include diuresis vs thoracentesis, will discuss further in AM with daughter     Transition to Hospice -palliative consult tomorrow to assist daughter in transitioning patient to hospice -continue home meds: aspirin, albuterol, synthroid, claritin, lasix and marinol   Dispo: Admit patient to Inpatient with expected length of stay greater than 2 midnights.  Signed: Al Decant, MD 12/03/2018, 7:40 PM  Pager: [phone]

## 2018-12-03 NOTE — ED Notes (Signed)
Primary Contact is daughter [phone]

## 2018-12-03 NOTE — ED Triage Notes (Addendum)
Caregiver called EMS pt went unresponsive and had stroke like symptoms, reporting left sided lean and gaze. Pt denies dizziness endorses h/a, a/o x4 now. Last known well today @1236 

## 2018-12-04 DIAGNOSIS — I2510 Atherosclerotic heart disease of native coronary artery without angina pectoris: Secondary | ICD-10-CM

## 2018-12-04 DIAGNOSIS — Z8669 Personal history of other diseases of the nervous system and sense organs: Secondary | ICD-10-CM

## 2018-12-04 DIAGNOSIS — R55 Syncope and collapse: Secondary | ICD-10-CM | POA: Diagnosis not present

## 2018-12-04 DIAGNOSIS — C3490 Malignant neoplasm of unspecified part of unspecified bronchus or lung: Secondary | ICD-10-CM | POA: Diagnosis not present

## 2018-12-04 DIAGNOSIS — Z515 Encounter for palliative care: Secondary | ICD-10-CM

## 2018-12-04 DIAGNOSIS — Z7189 Other specified counseling: Secondary | ICD-10-CM

## 2018-12-04 DIAGNOSIS — Z923 Personal history of irradiation: Secondary | ICD-10-CM

## 2018-12-04 DIAGNOSIS — Z8673 Personal history of transient ischemic attack (TIA), and cerebral infarction without residual deficits: Secondary | ICD-10-CM

## 2018-12-04 DIAGNOSIS — I509 Heart failure, unspecified: Secondary | ICD-10-CM

## 2018-12-04 DIAGNOSIS — C3492 Malignant neoplasm of unspecified part of left bronchus or lung: Secondary | ICD-10-CM

## 2018-12-04 DIAGNOSIS — J90 Pleural effusion, not elsewhere classified: Secondary | ICD-10-CM

## 2018-12-04 DIAGNOSIS — K5900 Constipation, unspecified: Secondary | ICD-10-CM

## 2018-12-04 DIAGNOSIS — J449 Chronic obstructive pulmonary disease, unspecified: Secondary | ICD-10-CM

## 2018-12-04 DIAGNOSIS — R5383 Other fatigue: Secondary | ICD-10-CM

## 2018-12-04 LAB — CBC
HCT: 31.2 % — ABNORMAL LOW (ref 39.0–52.0)
Hemoglobin: 9.8 g/dL — ABNORMAL LOW (ref 13.0–17.0)
MCH: 24.9 pg — ABNORMAL LOW (ref 26.0–34.0)
MCHC: 31.4 g/dL (ref 30.0–36.0)
MCV: 79.2 fL — ABNORMAL LOW (ref 80.0–100.0)
Platelets: 404 10*3/uL — ABNORMAL HIGH (ref 150–400)
RBC: 3.94 MIL/uL — ABNORMAL LOW (ref 4.22–5.81)
RDW: 17.3 % — ABNORMAL HIGH (ref 11.5–15.5)
WBC: 6.6 10*3/uL (ref 4.0–10.5)
nRBC: 0 % (ref 0.0–0.2)

## 2018-12-04 LAB — BASIC METABOLIC PANEL
Anion gap: 14 (ref 5–15)
BUN: 8 mg/dL (ref 8–23)
CO2: 21 mmol/L — ABNORMAL LOW (ref 22–32)
Calcium: 8.5 mg/dL — ABNORMAL LOW (ref 8.9–10.3)
Chloride: 94 mmol/L — ABNORMAL LOW (ref 98–111)
Creatinine, Ser: 0.81 mg/dL (ref 0.61–1.24)
GFR calc Af Amer: >60 mL/min (ref >60)
GFR calc non Af Amer: >60 mL/min (ref >60)
Glucose, Bld: 93 mg/dL (ref 70–99)
Potassium: 3.6 mmol/L (ref 3.5–5.1)
Sodium: 129 mmol/L — ABNORMAL LOW (ref 135–145)

## 2018-12-04 MED ORDER — FLEET ENEMA 7-19 GM/118ML RE ENEM
1.0000 | ENEMA | Freq: Once | RECTAL | Status: AC
  Administered 2018-12-04: 1 via RECTAL
  Filled 2018-12-04: qty 1

## 2018-12-04 MED ORDER — SENNOSIDES-DOCUSATE SODIUM 8.6-50 MG PO TABS: 2.0000 | ORAL_TABLET | Freq: Every day | ORAL | Status: DC

## 2018-12-04 MED ORDER — RAMELTEON 8 MG PO TABS
8.0000 mg | ORAL_TABLET | Freq: Every day | ORAL | Status: AC
  Administered 2018-12-04: 8 mg via ORAL
  Filled 2018-12-04: qty 1

## 2018-12-04 NOTE — Progress Notes (Addendum)
MD at the bedside. Aware of pt HR in 130s.

## 2018-12-04 NOTE — Progress Notes (Signed)
Pt notified staff that his caregiver was unable to care for him tonight.  MD made aware that pt would need to discharge tomorrow.  Transportation cancelled.

## 2018-12-04 NOTE — Progress Notes (Signed)
Paul Payment, MD aware of pt elevated HR. Will come and reassess pt. Pt remains asymptomatic.

## 2018-12-04 NOTE — Discharge Instructions (Signed)
You were admitted after a fall and workup in our ED demonstrated a collapse of your left lung with a large pleural effusion consistent with spread of your known lung cancer to the covering of your lung. You were admitted for possible intervention. However, given you were not symptomatic from the findings in your lung and you and your daughter's wishes were such that you not have invasive procedures, we decided not to intervene and workup your presentation any further. You will be discharged home with hospice.

## 2018-12-04 NOTE — Progress Notes (Signed)
Pt HR elevated, Afib 106-127, other VS stable. CCMD called with ST elevation of 2.5, pt now back to normal. Pt asymptomatic, no chest pain, SOB. HR down to 90s-110s. Pt still denies chest pain and SOB, no palpitations.  MD on call aware, will place new order if there is need for further evaluation.  Will continue to monitor pt closely.

## 2018-12-04 NOTE — Progress Notes (Signed)
Pt using bathroom, HR elevated.

## 2018-12-04 NOTE — Progress Notes (Signed)
[facility] ([facility])  Pt is active with our [facility] palliative care program.  Request to go home with hospice today.  No reported DME needs.  Forwarded information to our [facility].  Any questions, please call [phone]  Thank you, Venia Carbon RN, BSN, [facility] Liaison

## 2018-12-04 NOTE — Consult Note (Signed)
Consultation Note Date: 12/04/2018   Patient Name: Paul Schmitt  DOB: March 01, 1923  MRN: [id number]  Age / Sex: 85 y.o., male  PCP: Benson Setting, MD Referring Physician: Sid Falcon, MD  Reason for Consultation: Establishing goals of care and Hospice Evaluation  HPI/Patient Profile: 85 y.o. male  with past medical history of stage 4 lung cancer, CAD, CVA, CHF, COPD, hypothyroidism, vertigo, and a fib admitted on 12/03/2018 with a syncopal episode. Possibly r/t dehydration or seizure. Also troponin was elevated. No recommendations from cardiology as patient and family are interested in symptom management and no aggressive interventions. Imaging revealed complete opacification of L lung and lg pleural effusion - patient not symptomatic. PMT consulted for support for daughter and transition to hospice.   Clinical Assessment and Goals of Care: I have reviewed medical records including EPIC notes, labs and imaging, and received report from RN - RN shares that patient is c/o abd pain and states he needs to have a BM - requesting enema. This was ordered. Not short of breath and VSS. No other concerns.   I then spoke with patient's daughter, [name],  to discuss diagnosis prognosis, Powers, EOL wishes, disposition and options.  We first discussed patient's symptoms: abdominal pain/constipation and enema ordered. She reports patient does not take pain medicine and usually does not c/o pain. She is hopeful pain resolves after BM. Will hold off on ordering pain medication.  I introduced Palliative Medicine as specialized medical care for people living with serious illness. It focuses on providing relief from the symptoms and stress of a serious illness. The goal is to improve quality of life for both the patient and the family.  We discussed a brief life review of the patient. 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. She tells me he and his wife moved to [facility] after the loss of several children in 1 year. His wife passed away about 6 months ago in their home with the support of hospice.    As far as functional and nutritional status, [name] tells me of a decline. Sleeping much more recently. Hardly ambulatory - spends most of his time lying in bed or in the recliner and has developed a small pressure ulcer. When he does ambulate, he requires assistance and a walker. She tells me he is barely eating, very poor appetite. He is much weaker. She tells me his mind has remained sharp through his illness.    We discussed his current illness and what it means in the larger context of his on-going co-morbidities.  Natural disease trajectory and expectations at EOL were discussed. Nydam has good understanding of situation and diagnoses. She does ask about his pleural effusion and thoracentesis - we discussed possibility of this. After discussion pros/cons Hohmann elects to avoid thoracentesis, especially since patient is not short of breath.   I attempted to elicit values and goals of care important to the patient.  She tells me patient and family are ready to shift care to focus on comfort and keep patient at home.   The difference between aggressive medical intervention and comfort care was considered in light of the patient's goals of care.   Hospice and Palliative Care services outpatient were explained and offered. Donaghey is familiar with hospice care since her mother had their support 6 months ago. She is interested in hospice for her father.  In previous documents, patient's sister  [name] is listed as HCPOA - patient's daughter is unsure but tells me [name] has been included in conversations and agrees that comfort focused care with hospice is most appropriate. At this time, patient is able to make his own decisions.  Emotional support and active listening provided  to [name] throughout conversation.  Dr. Madilyn Fireman updated.   Questions and concerns were addressed. The family was encouraged to call with questions or concerns.   Primary Decision Maker PATIENT  Sister (maybe SIL) Muriel listed as HCPOA in previous meeting with outpatient palliative care    SUMMARY OF RECOMMENDATIONS   - home with hospice - declined thoracentesis - enema ordered, senna scheduled QHS  Code Status/Advance Care Planning:  DNR  Additional Recommendations (Limitations, Scope, Preferences):  Avoid Hospitalization, Initiate Comfort Feeding, No Chemotherapy, No Diagnostics, No Radiation and No Surgical Procedures  Psycho-social/Spiritual:   Desire for further Chaplaincy support:no  Additional Recommendations: Education on Hospice  Prognosis:   < 3 months  Discharge Planning: Home with Hospice      Primary Diagnoses: Present on Admission: . AMS (altered mental status)   I have reviewed the medical record, interviewed the patient and family, and examined the patient. The following aspects are pertinent.  Past Medical History:  Diagnosis Date  . Asthma   . Carotid arterial disease ([place])    a. 01/2015 CT neck: incidental finding of severe RICA stenosis;  b. 01/2015 Carotid U/S: RICA 59-56%, LICA 38-75%.  . Diastolic dysfunction    a. echo 08/2014: EF 60-65%, nl wall motion, GR1DD, mildly dilated LA, nl PASP  . Essential hypertension   . Normal coronary arteries    a. cardiac cath 1989: normal coronary arteries; b. 09/2014 nuclear stress test: no significant ischemia or scar, EF 49%.  . Palpitations    a. 09/2014 Event Monitor: sinus rhythm, rare pvc's, no notable arrhythmia.  . Parotid mass    a. 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  . Prostate cancer ([place])   . RBBB   . Syncope   . Vertigo    Social History   Socioeconomic History  . Marital status: Married    Spouse name: Not on file  . Number of children: Not  on file  . Years of education: Not on file  . Highest education level: Not on file  Occupational History  . Not on file  Social Needs  . Financial resource strain: Not on file  . Food insecurity    Worry: Not on file    Inability: Not on file  . Transportation needs    Medical: Not on file    Non-medical: Not on file  Tobacco Use  . Smoking status: Current Some Day Smoker    Packs/day: 0.25    Years: 70.00    Pack years: 17.50    Types: Cigarettes    Last attempt to quit: 05/29/2014    Years since quitting: 4.5  . Smokeless tobacco: Never Used  Substance and Sexual Activity  . Alcohol use: No  . Drug use: No  . Sexual activity: Not on file  Lifestyle  . Physical activity    Days per week: Not on file    Minutes per session: Not on file  . Stress: Not on file  Relationships  . Social Herbalist on phone: Not on file    Gets together: Not on file    Attends religious service: Not on file  Active member of club or organization: Not on file    Attends meetings of clubs or organizations: Not on file    Relationship status: Not on file  Other Topics Concern  . Not on file  Social History Narrative  . Not on file   Family History  Problem Relation Age of Onset  . Heart disease Mother   . Heart attack Mother 39  . Hyperlipidemia Mother   . Hypertension Mother   . Heart disease Sister   . Heart attack Sister 51  . Heart disease Brother   . Heart attack Brother 73   Scheduled Meds: . aspirin EC  81 mg Oral Daily  . dronabinol  2.5 mg Oral BID AC  . enoxaparin (LOVENOX) injection  40 mg Subcutaneous Q24H  . furosemide  20 mg Oral Daily  . levothyroxine  50 mcg Oral Q0600  . loratadine  10 mg Oral Daily   Continuous Infusions: PRN Meds:.acetaminophen **OR** acetaminophen, albuterol, senna-docusate No Known Allergies  Vital Signs: BP 103/87 (BP Location: Left Arm)   Pulse (!) 128   Temp 97.9 F (36.6 C)   Resp 18   Ht 6' (1.829 m)   Wt 68.5 kg    SpO2 100%   BMI 20.48 kg/m  Pain Scale: 0-10   Pain Score: 2    SpO2: SpO2: 100 % O2 Device:SpO2: 100 % O2 Flow Rate: .   IO: Intake/output summary: No intake or output data in the 24 hours ending 12/04/18 1032  LBM: Last BM Date: 12/03/18 Baseline Weight: Weight: 61.2 kg Most recent weight: Weight: 68.5 kg     Palliative Assessment/Data: PPS 20%    The above conversation was completed via telephone due to the visitor restrictions during the COVID-19 pandemic. Thorough chart review and discussion with necessary members of the care team was completed as part of assessment. All issues were discussed and addressed but no physical exam was performed.  Time Total: 50 minutes Greater than 50%  of this time was spent counseling and coordinating care related to the above assessment and plan.  Juel Burrow, DNP, AGNP-C Palliative Medicine Team [phone] Pager: [phone]

## 2018-12-04 NOTE — Progress Notes (Signed)
  Date: 12/04/2018  Patient name: Paul Schmitt  Medical record number: [id number]  Date of birth: Aug 27, 1922   I have seen and evaluated Paul Schmitt and discussed their care with the Residency Team. Briefly, [name] is a 85 year old man with Stage 4 lung cancer and many other chronic diseases who presented for weakness, fatigue, syncope after 1 week of decreased PO intake.  Possibly a seizure given initial findings.  Long discussion with family was had and palliative care was involved, and per family and patient wishes, no further diagnostic work up will take place.  Family is interested in options for therapeutic relief, however, thoracentesis or other invasive procedures would not add to his care at this time.     Vitals:   12/04/18 0540 12/04/18 1432  BP: 103/87 129/74  Pulse: (!) 128 (!) 114  Resp: 18 16  Temp: 97.9 F (36.6 C) 97.7 F (36.5 C)  SpO2: 100% 99%   General: Pleasant gentleman, lying in bed, no acute distress, thin and chronically ill Eyes: Anicteric sclerae HENT: Temporal wasting Pulm: Breathing comfortably, decreased breath sounds on the left side with wheezing Skin: Thin, dry  Assessment and Plan: I have seen and evaluated the patient as outlined above. I agree with the formulated Assessment and Plan as detailed in the residents' note, with the following changes:   1. Stage 4 lung cancer, squamous cell - Transition to palliative/hospice - Palliative care consult - Consult for home health needs - He is not requiring oxygen, no further diagnostic interventions  2. Syncope, fatigue - Likely in the setting of poor PO intake, possible seizure - Will avoid EEG at this time - Monitor for changes  Other issues per Dr. Webb Silversmith note.   Sid Falcon, MD 9/8/20204:10 PM

## 2018-12-04 NOTE — TOC Transition Note (Signed)
Transition of Care [facility]) - CM/SW Discharge Note   Patient Details  Name: Espn Zeman MRN: [id number] Date of Birth: [birth year]  [facility] CM/SW Contact:  Bartholomew Crews, RN Phone Number: [phone] 12/04/2018, 5:13 PM   Clinical Narrative:    Patient to transition home with hospice [facility] today. Spoke with daughter about transport needs. [facility] contacted for transport home. Daughter requests to be notified when transport picks up patient. RN aware. No further transition of care needs identified.    Final next level of care: Home w Hospice Care Barriers to Discharge: No Barriers Identified   Patient Goals and CMS Choice Patient states their goals for this hospitalization and ongoing recovery are:: return home CMS Medicare.gov Compare Post Acute Care list provided to:: Patient Choice offered to / list presented to : Patient, Adult Children  Discharge Placement                       Discharge Plan and Services In-house Referral: Hospice / Palliative Care Discharge Planning Services: CM Consult Post Acute Care Choice: Hospice          DME Arranged: N/A DME Agency: NA       HH Arranged: NA HH Agency: NA        Social Determinants of Health (SDOH) Interventions     Readmission Risk Interventions No flowsheet data found.

## 2018-12-04 NOTE — Discharge Summary (Signed)
Name: Paul Schmitt MRN: [id number] DOB: [birth year] 85 y.o. PCP: Benson Setting, MD  Date of Admission: 12/03/2018  1:37 PM Date of Discharge: 12/05/2018  Attending Physician: No att. providers found  Discharge Diagnosis:  1. Fall/Syncope 2. SCC Lung/Pleural Carcinomatosis/Complete Atelectasis Left Lung/Large Left Pleural Effusion  Discharge Medications: Allergies as of 12/05/2018   No Known Allergies     Medication List    TAKE these medications   albuterol 108 (90 Base) MCG/ACT inhaler Commonly known as: VENTOLIN HFA Inhale 2 puffs into the lungs every 6 (six) hours as needed for wheezing or shortness of breath.   albuterol (2.5 MG/3ML) 0.083% nebulizer solution Commonly known as: PROVENTIL Take 2.5 mg by nebulization every 6 (six) hours as needed for wheezing or shortness of breath.   amLODipine 10 MG tablet Commonly known as: NORVASC Take 10 mg by mouth every morning.   aspirin EC 81 MG tablet Take 1 tablet (81 mg total) by mouth daily.   atorvastatin 40 MG tablet Commonly known as: LIPITOR Take 1 tablet (40 mg total) by mouth daily. Please make annual appt for future refills. Thank you   dronabinol 2.5 MG capsule Commonly known as: MARINOL Take 2.5 mg by mouth 2 (two) times daily before a meal.   furosemide 20 MG tablet Commonly known as: LASIX Take 20 mg by mouth every morning.   levothyroxine 50 MCG tablet Commonly known as: SYNTHROID Take 50 mcg by mouth daily before breakfast.   loratadine 10 MG tablet Commonly known as: CLARITIN Take 10 mg by mouth every morning.   traZODone 100 MG tablet Commonly known as: DESYREL Take 100 mg by mouth at bedtime.       Disposition and follow-up:   [name] was discharged from [facility] in [place] condition discharged w/ home hospice w/ out need for follow up.   Follow-up Appointments: [facility] Course by problem list:  1. Fall/Syncope -pt presented after a supposed syncopal episode  secondary to poor PO vs. Possible seizure, after discussions with daughter she requested no further diagnostic workup and transition to hospice  2. SCC Lung/Pleural Carcinomatosis/Complete Atelectasis Left Lung/Large Left Pleural Effusion -CTA obtained in ED demonstrated complete atelectasis of the left lung, a large left pleural effusion and pleural carcinomatosis, however patient asymptomatic and satting well on RA; after discussions w/ daughter she didn't want any intervention and requested patient go home w/ hospice  Discharge Vitals:   BP 117/87 (BP Location: Left Arm)   Pulse (!) 117   Temp 97.7 F (36.5 C) (Oral)   Resp 18   Ht 6' (1.829 m)   Wt 68.5 kg   SpO2 100%   BMI 20.48 kg/m   Pertinent Labs, Studies, and Procedures:   CTA: No evidence of pulmonary embolism.  Complete atelectasis of the LEFT lung with a suspected medial LEFT upper lobe mass invading the mediastinum, question 4.1 x 2.5 cm though this is difficult to delineate from adjacent atelectatic lung.  Narrowing of central pulmonary arteries in LEFT lung suggesting presence of hilar adenopathy.  Enlarged azygo esophageal recess node.  Large LEFT pleural effusion with multiple pleural-based nodules and diffuse pleural thickening consistent with pleural carcinomatosis.  Two hypervascular foci within the liver question vascular phenomenon versus hypervascular metastases.  Aortic Atherosclerosis (ICD10-I70.0).   Discharge Instructions: Discharge Instructions    Diet - low sodium heart healthy   Complete by: As directed    Increase activity slowly   Complete by: As directed  Discharge Instructions     You were admitted after a fall and workup in our ED demonstrated a collapse of your left lung with a large pleural effusion consistent with spread of your known lung cancer to the covering of your lung. You were admitted for possible intervention. However, given you were not symptomatic from  the findings in your lung and you and your daughter's wishes were such that you not have invasive procedures, we decided not to intervene and workup your presentation any further. You will be discharged home with hospice.    Signed: Al Decant, MD 12/06/2018, 8:17 AM   Pager: [phone]

## 2018-12-04 NOTE — TOC Initial Note (Signed)
Transition of Care [facility]) - Initial/Assessment Note    Patient Details  Name: Paul Schmitt MRN: [id number] Date of Birth: 03/14/1923  [facility] CM/SW Contact:    Bartholomew Crews, RN Phone Number: 12/04/2018, 3:46 PM  Clinical Narrative:                 Received consult for hospice at home. Noted patient already active with [facility] for palliative care. Referral called into to hospice liaison. Patient to transition home today. No other transition of care needs identified at this time.   Expected Discharge Plan: Home w Hospice Care Barriers to Discharge: No Barriers Identified   Patient Goals and CMS Choice Patient states their goals for this hospitalization and ongoing recovery are:: return home CMS Medicare.gov Compare Post Acute Care list provided to:: Patient Choice offered to / list presented to : Patient, Adult Children  Expected Discharge Plan and Services Expected Discharge Plan: Home w Hospice Care In-house Referral: Hospice / Palliative Care Discharge Planning Services: CM Consult Post Acute Care Choice: Hospice Living arrangements for the past 2 months: Single Family Home                 DME Arranged: N/A DME Agency: NA       HH Arranged: NA HH Agency: NA        Prior Living Arrangements/Services Living arrangements for the past 2 months: Single Family Home Lives with:: Self, Adult Children          Need for Family Participation in Patient Care: Yes (Comment) Care giver support system in place?: Yes (comment) Current home services: Other (comment)(palliative) Criminal Activity/Legal Involvement Pertinent to Current Situation/Hospitalization: No - Comment as needed  Activities of Daily Living Home Assistive Devices/Equipment: Wheelchair ADL Screening (condition at time of admission) Patient's cognitive ability adequate to safely complete daily activities?: Yes Is the patient deaf or have difficulty hearing?: No Does the patient have difficulty  seeing, even when wearing glasses/contacts?: Yes Does the patient have difficulty concentrating, remembering, or making decisions?: No Patient able to express need for assistance with ADLs?: Yes Does the patient have difficulty dressing or bathing?: Yes Independently performs ADLs?: No Communication: Independent Dressing (OT): Needs assistance Is this a change from baseline?: Pre-admission baseline Grooming: Needs assistance Is this a change from baseline?: Pre-admission baseline Feeding: Independent Bathing: Needs assistance Is this a change from baseline?: Pre-admission baseline Toileting: Needs assistance Is this a change from baseline?: Pre-admission baseline In/Out Bed: Needs assistance Is this a change from baseline?: Pre-admission baseline Walks in Home: Independent with device (comment) Does the patient have difficulty walking or climbing stairs?: Yes Weakness of Legs: Both Weakness of Arms/Hands: Both  Permission Sought/Granted                  Emotional Assessment         Alcohol / Substance Use: Not Applicable Psych Involvement: No (comment)  Admission diagnosis:  Pleural effusion on left [J90] Malignant neoplasm of lung, unspecified laterality, unspecified part of lung (HCC) [C34.90] Syncope, unspecified syncope type [R55] Mass of left lung [R91.8] Patient Active Problem List   Diagnosis Date Noted  . Malignant neoplasm of lung ([place])   . Goals of care, counseling/discussion   . Palliative care by specialist   . AMS (altered mental status) 12/03/2018  . Venous stasis 11/02/2018  . Atrial fibrillation ([place]) 07/24/2018  . History of stroke 07/24/2018  . Dyspnea on exertion 07/24/2018  . Palpitations   . Carotid arterial  disease ([place])   . Uncontrolled hypertension   . Vertigo   . Diastolic dysfunction   . Syncope   . Intermittent palpitations 08/26/2014  . Chest tightness 08/26/2014  . Fatigue - with exercise intolerance 08/26/2014  . Bradycardia  08/26/2014   PCP:  Benson Setting, MD Pharmacy:   [facility], [place] [address] Phone: [phone] Fax: [phone]     Social Determinants of Health (SDOH) Interventions    Readmission Risk Interventions No flowsheet data found.

## 2018-12-04 NOTE — Progress Notes (Signed)
Subjective: Patient is very pleasant on exam, says he would like enema for constipation. Plan to go home with hospice today, he is looking forward to going home.   Consults: palliative  Objective:  Vital signs in last 24 hours: Vitals:   12/04/18 0255 12/04/18 0306 12/04/18 0315 12/04/18 0540  BP: 107/66 114/77 106/78 103/87  Pulse: (!) 129   (!) 128  Resp: 18   18  Temp:    97.9 F (36.6 C)  TempSrc:      SpO2:    100%  Weight:      Height:       Physical Exam  Constitutional: No distress.  Cardiovascular:  Tachy, irregularly irregular  Pulmonary/Chest: Effort normal.  Neurological: He is alert.  Skin: Skin is warm and dry. He is not diaphoretic.  Nursing note and vitals reviewed.  Labs:  CMP Latest Ref Rng & Units 12/04/2018 12/03/2018 11/02/2018  Glucose 70 - 99 mg/dL 93 104(H) 116(H)  BUN 8 - 23 mg/dL 8 8 10   Creatinine 0.61 - 1.24 mg/dL 0.81 0.85 0.82  Sodium 135 - 145 mmol/L 129(L) 131(L) 134(L)  Potassium 3.5 - 5.1 mmol/L 3.6 3.7 3.8  Chloride 98 - 111 mmol/L 94(L) 97(L) 99  CO2 22 - 32 mmol/L 21(L) 24 27  Calcium 8.9 - 10.3 mg/dL 8.5(L) 8.5(L) 8.7(L)  Total Protein 6.5 - 8.1 g/dL - 7.6 -  Total Bilirubin 0.3 - 1.2 mg/dL - 0.6 -  Alkaline Phos 38 - 126 U/L - 67 -  AST 15 - 41 U/L - 19 -  ALT 0 - 44 U/L - 12 -   CBC Latest Ref Rng & Units 12/04/2018 12/03/2018 11/02/2018  WBC 4.0 - 10.5 K/uL 6.6 6.7 6.8  Hemoglobin 13.0 - 17.0 g/dL 9.8(L) 9.6(L) 9.0(L)  Hematocrit 39.0 - 52.0 % 31.2(L) 30.9(L) 29.4(L)  Platelets 150 - 400 K/uL 404(H) 340 357    Assessment/Plan:  Assessment: Paul Schmitt is a 85 yo M w/ a 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 Seizure w/ an elevated troponin w/ unremarkable EKG concerning for NSTEMI currently transitioning to hospice.   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 Seizure but after discussing options with daughter including EEG, daughter declined that intervention with intent to reevaluate goals of care today -palliative consulted to assist daughter w/ goals of care/transitioning to hospice    [facility] Lung/Pleural carcinomatosis/complete atelectasis of left lung/large left pleural effusion -patient w/ known stage 4 lung  cancer w/ chronic cough but no complaints of SOB -imaging in ED demonstrated complete atelectasis of the left lung, a large left pleural effusion w/ multiple pleural nodules and pleural thickening consistent w/ pleural carcinomatosis -possible interventions include diuresis, thoracentesis, will discuss further in AM w/ daughter -given patient largely asymptomatic, may consider dc home with hospice w/ out intervention    Abdominal pain/constipation: -pt reports not having a normal bm recently and abdominal pain consistent with constipation; would like an enema -enema ordered     Goals of care/Transition to hospice -daughter reports patient is being  transitioned to hospice and does not want diagnostic procedures vs. Therapeutic procedures; said she will think about her wishes for her father more before she comes in today  -consulted palliative for assistance with helping daughter make these decisions with possible plan to dc to home with hospice today  Dispo: Anticipated discharge today   Al Decant, MD 12/04/2018, 6:08 AM Pager: [phone]

## 2018-12-04 NOTE — Progress Notes (Signed)
Pt HR 120s-130s, BP 106/78. Pt denied chest pain, SOB.  Marianna Payment, MD notified.

## 2018-12-04 NOTE — Progress Notes (Signed)
Paged at 2126 for Tylenol PM or another sleep aid. Spoke with Coe at 2141 and informed of request for sleep aid like Tylenol PM; orders placed.   Informed MD Coe upon arrival to unit at approx 2200 of patient's HR in 110's up to 130 with A fib. MD aware and will continue to monitor; no interventions at this time. Informed to renew telemetry orders.   Notified by tele at approx 0530 patient HR in 150's, RN to bedside. Patient soiled and RN's assisting with peri care and changing bed.

## 2018-12-04 NOTE — Progress Notes (Signed)
Palliative Medicine RN Note: Patient is active with the palliative side of [facility] ([facility]  Marjie Skiff Marinda Tyer, RN, BSN, [facility] Palliative Medicine Team 12/04/2018 9:06 AM Office [phone]

## 2018-12-05 NOTE — Progress Notes (Signed)
  Date: 12/05/2018  Patient name: Paul Schmitt  Medical record number: [id number]  Date of birth: Jul 03, 1922        I have seen and evaluated this patient and I have discussed the plan of care with the house staff. Please see Dr. Webb Silversmith note for complete details. I concur with her findings and plan.  Discharge home today.  Sid Falcon, MD 12/05/2018, 4:49 PM

## 2018-12-05 NOTE — Progress Notes (Signed)
Patient will DC to: Home Anticipated DC date: 12/05/18 Family notified: Daughter Transport by: [facility] 1:30pm   DC packet on chart. Ambulance transport requested for patient.   CSW will sign off for now as social work intervention is no longer needed. Please consult [place] again if new needs arise.  Cedric Fishman, LCSW Clinical Social Worker [phone]

## 2018-12-05 NOTE — Progress Notes (Signed)
Patient was discharged home with hospice by MD order; discharged instructions review and sent to patient's daughter via PTATwith care notes; IV DIC; skin - pressure ulcer stg II on sacrum; patient will be transported to his house via [facility]. Patient's daughter is aware about his discharge.

## 2018-12-05 NOTE — Progress Notes (Signed)
Writer called patient's daughter [place] and informed her that patient is D/C today. Patient will need transportation via [facility] and his daughter will be able to meet him at his house at 13:30 o'clock. CSW made aware. Will continue to monitor.

## 2018-12-05 NOTE — Progress Notes (Signed)
   Subjective: Patient is very pleasant on exam. Had a BM yesterday and needs to have another right now. Abdominal pain improved. Plan to go home with hospice today. He is looking forward to going home.   Consults: palliative  Objective:  Vital signs in last 24 hours: Vitals:   12/04/18 0540 12/04/18 1432 12/04/18 2144 12/05/18 0616  BP: 103/87 129/74 132/83 108/71  Pulse: (!) 128 (!) 114 (!) 103 (!) 129  Resp: 18 16    Temp: 97.9 F (36.6 C) 97.7 F (36.5 C) 98.7 F (37.1 C) 97.8 F (36.6 C)  TempSrc:    Oral  SpO2: 100% 99% 98% 100%  Weight:      Height:       Physical Exam  Constitutional: No distress.  Cardiovascular:  Tachy, irregularly irregular  Pulmonary/Chest: Effort normal.  Neurological: He is alert.  Skin: Skin is warm and dry. He is not diaphoretic.  Nursing note and vitals reviewed.  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 Seizure w/ an elevated troponin w/ unremarkable EKG concerning for NSTEMI currently transitioning to hospice.   Plan: Active Problems:   Fall/Syncope -pt presented after a supposed syncopal episode upon rising from the bedside commode during which he became rigid, fell to the ground and started shaking w/ incontinence of urine and an unresponsive/incoherent period following the episode -patient has a hx of syncopal events secondary to dehydration and patient did report poor po for the last week -story concerning for seizure and daughter states patient has a hx of seizures, last one 5 years ago, that he never sought treatment for for fear he would lose his license -seizure origin could be organic or could be secondary to metastases to the brain -syncope could also be secondary to an ACS as patient's troponin was  elevated to 203 on admission and uptrended to 323 at which time we consulted cardiology who declined intervention in this patient given age and plan to transition to hospice  -numerous ways to evaluate for patient's syncopal episode vs. Seizure but after discussing options with daughter including EEG she declines all additional interventions -home with hospice per daughter    [facility] Lung/Pleural carcinomatosis/complete atelectasis of left lung/large left pleural effusion -patient w/ known stage 4 lung cancer w/ chronic cough but no complaints of SOB -imaging in ED demonstrated complete atelectasis of the left lung, a large left pleural effusion w/ multiple pleural nodules and pleural thickening consistent w/ pleural carcinomatosis -possible interventions include diuresis, thoracentesis; per discussion with daughter no interventions -home with hospice     Goals of care/Transition to hospice -daughter reports patient is being transitioned to hospice and does not want diagnostic procedures vs. Therapeutic procedures; after discussion with palliative daughter wishes for patient to go home with hospice - d/c home with hospice today  Dispo: Anticipated discharge today, home with hospice   Paul Decant, MD 12/05/2018, 11:26 AM Pager: [phone]

## 2018-12-06 ENCOUNTER — Emergency Department

## 2018-12-06 ENCOUNTER — Encounter: Payer: Self-pay | Admitting: Emergency Medicine

## 2018-12-06 ENCOUNTER — Other Ambulatory Visit: Payer: Self-pay

## 2018-12-06 ENCOUNTER — Observation Stay
Admission: EM | Admit: 2018-12-06 | Discharge: 2018-12-08 | Disposition: A | Discharge status: Home / Self Care | Attending: Internal Medicine | Admitting: Internal Medicine

## 2018-12-06 DIAGNOSIS — Z66 Do not resuscitate: Secondary | ICD-10-CM | POA: Diagnosis not present

## 2018-12-06 DIAGNOSIS — I4891 Unspecified atrial fibrillation: Secondary | ICD-10-CM | POA: Insufficient documentation

## 2018-12-06 DIAGNOSIS — E039 Hypothyroidism, unspecified: Secondary | ICD-10-CM | POA: Insufficient documentation

## 2018-12-06 DIAGNOSIS — Z87891 Personal history of nicotine dependence: Secondary | ICD-10-CM | POA: Diagnosis not present

## 2018-12-06 DIAGNOSIS — I110 Hypertensive heart disease with heart failure: Secondary | ICD-10-CM | POA: Insufficient documentation

## 2018-12-06 DIAGNOSIS — Z79899 Other long term (current) drug therapy: Secondary | ICD-10-CM | POA: Diagnosis not present

## 2018-12-06 DIAGNOSIS — Z8249 Family history of ischemic heart disease and other diseases of the circulatory system: Secondary | ICD-10-CM | POA: Insufficient documentation

## 2018-12-06 DIAGNOSIS — J45909 Unspecified asthma, uncomplicated: Secondary | ICD-10-CM | POA: Insufficient documentation

## 2018-12-06 DIAGNOSIS — I2510 Atherosclerotic heart disease of native coronary artery without angina pectoris: Secondary | ICD-10-CM | POA: Insufficient documentation

## 2018-12-06 DIAGNOSIS — I5032 Chronic diastolic (congestive) heart failure: Secondary | ICD-10-CM | POA: Diagnosis not present

## 2018-12-06 DIAGNOSIS — Z8673 Personal history of transient ischemic attack (TIA), and cerebral infarction without residual deficits: Secondary | ICD-10-CM | POA: Diagnosis not present

## 2018-12-06 DIAGNOSIS — Z923 Personal history of irradiation: Secondary | ICD-10-CM | POA: Insufficient documentation

## 2018-12-06 DIAGNOSIS — R569 Unspecified convulsions: Principal | ICD-10-CM | POA: Insufficient documentation

## 2018-12-06 DIAGNOSIS — E876 Hypokalemia: Secondary | ICD-10-CM | POA: Diagnosis not present

## 2018-12-06 DIAGNOSIS — Z20828 Contact with and (suspected) exposure to other viral communicable diseases: Secondary | ICD-10-CM | POA: Diagnosis not present

## 2018-12-06 DIAGNOSIS — C3490 Malignant neoplasm of unspecified part of unspecified bronchus or lung: Secondary | ICD-10-CM | POA: Diagnosis present

## 2018-12-06 DIAGNOSIS — Z7982 Long term (current) use of aspirin: Secondary | ICD-10-CM | POA: Insufficient documentation

## 2018-12-06 DIAGNOSIS — I4510 Unspecified right bundle-branch block: Secondary | ICD-10-CM | POA: Diagnosis not present

## 2018-12-06 DIAGNOSIS — Z8546 Personal history of malignant neoplasm of prostate: Secondary | ICD-10-CM | POA: Diagnosis not present

## 2018-12-06 DIAGNOSIS — Z79890 Hormone replacement therapy (postmenopausal): Secondary | ICD-10-CM | POA: Insufficient documentation

## 2018-12-06 LAB — COMPREHENSIVE METABOLIC PANEL
ALT: 10 U/L (ref 0–44)
AST: 16 U/L (ref 15–41)
Albumin: 2.2 g/dL — ABNORMAL LOW (ref 3.5–5.0)
Alkaline Phosphatase: 58 U/L (ref 38–126)
Anion gap: 9 (ref 5–15)
BUN: 11 mg/dL (ref 8–23)
CO2: 27 mmol/L (ref 22–32)
Calcium: 8.2 mg/dL — ABNORMAL LOW (ref 8.9–10.3)
Chloride: 98 mmol/L (ref 98–111)
Creatinine, Ser: 0.77 mg/dL (ref 0.61–1.24)
GFR calc Af Amer: >60 mL/min (ref >60)
GFR calc non Af Amer: >60 mL/min (ref >60)
Glucose, Bld: 103 mg/dL — ABNORMAL HIGH (ref 70–99)
Potassium: 3 mmol/L — ABNORMAL LOW (ref 3.5–5.1)
Sodium: 134 mmol/L — ABNORMAL LOW (ref 135–145)
Total Bilirubin: 0.6 mg/dL (ref 0.3–1.2)
Total Protein: 7.1 g/dL (ref 6.5–8.1)

## 2018-12-06 LAB — CBC WITH DIFFERENTIAL/PLATELET
Abs Immature Granulocytes: 0.04 10*3/uL (ref 0.00–0.07)
Basophils Absolute: 0 10*3/uL (ref 0.0–0.1)
Basophils Relative: 0 %
Eosinophils Absolute: 0.1 10*3/uL (ref 0.0–0.5)
Eosinophils Relative: 1 %
HCT: 31.2 % — ABNORMAL LOW (ref 39.0–52.0)
Hemoglobin: 10 g/dL — ABNORMAL LOW (ref 13.0–17.0)
Immature Granulocytes: 1 %
Lymphocytes Relative: 5 %
Lymphs Abs: 0.4 10*3/uL — ABNORMAL LOW (ref 0.7–4.0)
MCH: 25.1 pg — ABNORMAL LOW (ref 26.0–34.0)
MCHC: 32.1 g/dL (ref 30.0–36.0)
MCV: 78.4 fL — ABNORMAL LOW (ref 80.0–100.0)
Monocytes Absolute: 0.4 10*3/uL (ref 0.1–1.0)
Monocytes Relative: 6 %
Neutro Abs: 6.2 10*3/uL (ref 1.7–7.7)
Neutrophils Relative %: 87 %
Platelets: 403 10*3/uL — ABNORMAL HIGH (ref 150–400)
RBC: 3.98 MIL/uL — ABNORMAL LOW (ref 4.22–5.81)
RDW: 17.2 % — ABNORMAL HIGH (ref 11.5–15.5)
WBC: 7.2 10*3/uL (ref 4.0–10.5)
nRBC: 0 % (ref 0.0–0.2)

## 2018-12-06 LAB — URINE CULTURE: Culture: >=100000 — AB

## 2018-12-06 LAB — SARS CORONAVIRUS 2 BY RT PCR (HOSPITAL ORDER, PERFORMED IN ~~LOC~~ HOSPITAL LAB): SARS Coronavirus 2: NEGATIVE

## 2018-12-06 LAB — LIPASE, BLOOD: Lipase: 29 U/L (ref 11–51)

## 2018-12-06 MED ORDER — ENOXAPARIN SODIUM 40 MG/0.4ML ~~LOC~~ SOLN
40.0000 mg | SUBCUTANEOUS | Status: DC
  Administered 2018-12-06 – 2018-12-07 (×2): 40 mg via SUBCUTANEOUS
  Filled 2018-12-06: qty 0.4

## 2018-12-06 MED ORDER — SODIUM CHLORIDE 0.9 % IV BOLUS
500.0000 mL | Freq: Once | INTRAVENOUS | Status: AC
  Administered 2018-12-06: 500 mL via INTRAVENOUS

## 2018-12-06 MED ORDER — ACETAMINOPHEN 650 MG RE SUPP: 650.0000 mg | Freq: Four times a day (QID) | RECTAL | Status: DC | PRN

## 2018-12-06 MED ORDER — ACETAMINOPHEN 325 MG PO TABS: 650.0000 mg | ORAL_TABLET | Freq: Four times a day (QID) | ORAL | Status: DC | PRN

## 2018-12-06 MED ORDER — ALBUTEROL SULFATE HFA 108 (90 BASE) MCG/ACT IN AERS: 2.0000 | INHALATION_SPRAY | Freq: Four times a day (QID) | RESPIRATORY_TRACT | Status: DC | PRN

## 2018-12-06 MED ORDER — POTASSIUM CHLORIDE CRYS ER 20 MEQ PO TBCR
40.0000 meq | EXTENDED_RELEASE_TABLET | Freq: Once | ORAL | Status: AC
  Administered 2018-12-06: 20:00:00 40 meq via ORAL
  Filled 2018-12-06: qty 2

## 2018-12-06 MED ORDER — ONDANSETRON HCL 4 MG PO TABS: 4.0000 mg | ORAL_TABLET | Freq: Four times a day (QID) | ORAL | Status: DC | PRN

## 2018-12-06 MED ORDER — ONDANSETRON HCL 4 MG/2ML IJ SOLN: 4.0000 mg | Freq: Four times a day (QID) | INTRAMUSCULAR | Status: DC | PRN

## 2018-12-06 MED ORDER — ALBUTEROL SULFATE (2.5 MG/3ML) 0.083% IN NEBU: 2.5000 mg | INHALATION_SOLUTION | Freq: Four times a day (QID) | RESPIRATORY_TRACT | Status: DC | PRN

## 2018-12-06 MED ORDER — LEVETIRACETAM IN NACL 1000 MG/100ML IV SOLN
1000.0000 mg | Freq: Once | INTRAVENOUS | Status: AC
  Administered 2018-12-06: 19:00:00 1000 mg via INTRAVENOUS
  Filled 2018-12-06: qty 100

## 2018-12-06 MED ORDER — IOHEXOL 300 MG/ML  SOLN
75.0000 mL | Freq: Once | INTRAMUSCULAR | Status: AC | PRN
  Administered 2018-12-06: 18:00:00 75 mL via INTRAVENOUS

## 2018-12-06 MED ORDER — LORATADINE 10 MG PO TABS
10.0000 mg | ORAL_TABLET | Freq: Every morning | ORAL | Status: DC
  Administered 2018-12-07 – 2018-12-08 (×2): 10 mg via ORAL
  Filled 2018-12-06 (×2): qty 1

## 2018-12-06 MED ORDER — AMLODIPINE BESYLATE 10 MG PO TABS
10.0000 mg | ORAL_TABLET | Freq: Every morning | ORAL | Status: DC
  Administered 2018-12-07 – 2018-12-08 (×2): 10 mg via ORAL
  Filled 2018-12-06 (×2): qty 1

## 2018-12-06 MED ORDER — TRAZODONE HCL 50 MG PO TABS
100.0000 mg | ORAL_TABLET | Freq: Every day | ORAL | Status: DC
  Administered 2018-12-06 – 2018-12-07 (×2): 100 mg via ORAL
  Filled 2018-12-06 (×2): qty 2

## 2018-12-06 MED ORDER — FUROSEMIDE 20 MG PO TABS
20.0000 mg | ORAL_TABLET | Freq: Every morning | ORAL | Status: DC
  Administered 2018-12-07 – 2018-12-08 (×2): 20 mg via ORAL
  Filled 2018-12-06 (×2): qty 1

## 2018-12-06 MED ORDER — LEVOTHYROXINE SODIUM 50 MCG PO TABS
50.0000 ug | ORAL_TABLET | Freq: Every day | ORAL | Status: DC
  Administered 2018-12-07 – 2018-12-08 (×2): 50 ug via ORAL
  Filled 2018-12-06 (×2): qty 1

## 2018-12-06 MED ORDER — ASPIRIN EC 81 MG PO TBEC
81.0000 mg | DELAYED_RELEASE_TABLET | Freq: Every day | ORAL | Status: DC
  Administered 2018-12-07 – 2018-12-08 (×2): 81 mg via ORAL
  Filled 2018-12-06 (×2): qty 1

## 2018-12-06 MED ORDER — DRONABINOL 2.5 MG PO CAPS
2.5000 mg | ORAL_CAPSULE | Freq: Two times a day (BID) | ORAL | Status: DC
  Administered 2018-12-07 – 2018-12-08 (×3): 2.5 mg via ORAL
  Filled 2018-12-06 (×3): qty 1

## 2018-12-06 NOTE — ED Triage Notes (Signed)
Pt via EMS from home. Per EMS pt had a seizure x2 that lasted approx. 1-2 mins Once at home and once with EMS. Pt does have hx of seizures. On arrival pt is A&O x4 and c/o a headache. Pt is a hospice pt

## 2018-12-06 NOTE — ED Provider Notes (Signed)
[facility] Emergency Department Provider Note  ____________________________________________  Time seen: Approximately 3:53 PM  I have reviewed the triage vital signs and the nursing notes.   HISTORY  Chief Complaint Seizures    HPI Paul Schmitt is a 85 y.o. male with a history of CAD, diastolic heart failure, hypertension, prostate cancer and lung cancer, recently enrolled in home hospice today, who is brought to the ED due to seizures at home.  He had 3 convulsive seizures at home.  This is new, he does not have a history of epilepsy.  No recent trauma or medication changes.  EMS report that he also had a seizure with them that lasted about 1 or 2 minutes and resolved on its own.  No known recent illness. Family member at bedside reports he is still off from his baseline mental status.  She notes that he has not been eating well for the past several days.   Past Medical History:  Diagnosis Date  . Asthma   . Carotid arterial disease ([place])    a. 01/2015 CT neck: incidental finding of severe RICA stenosis;  b. 01/2015 Carotid U/S: RICA 52-84%, LICA 13-24%.  . Diastolic dysfunction    a. echo 08/2014: EF 60-65%, nl wall motion, GR1DD, mildly dilated LA, nl PASP  . Essential hypertension   . Normal coronary arteries    a. cardiac cath 1989: normal coronary arteries; b. 09/2014 nuclear stress test: no significant ischemia or scar, EF 49%.  . Palpitations    a. 09/2014 Event Monitor: sinus rhythm, rare pvc's, no notable arrhythmia.  . Parotid mass    a. 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  . Prostate cancer ([place])   . RBBB   . Syncope   . Vertigo      Patient Active Problem List   Diagnosis Date Noted  . Malignant neoplasm of lung ([place])   . Goals of care, counseling/discussion   . Palliative care by specialist   . Pleural effusion on left   . AMS (altered mental status) 12/03/2018  . Venous stasis 11/02/2018  .  Atrial fibrillation ([place]) 07/24/2018  . History of stroke 07/24/2018  . Dyspnea on exertion 07/24/2018  . Palpitations   . Carotid arterial disease ([place])   . Uncontrolled hypertension   . Vertigo   . Diastolic dysfunction   . Syncope   . Intermittent palpitations 08/26/2014  . Chest tightness 08/26/2014  . Fatigue - with exercise intolerance 08/26/2014  . Bradycardia 08/26/2014     Past Surgical History:  Procedure Laterality Date  . HERNIA REPAIR       Prior to Admission medications   Medication Sig Start Date End Date Taking? Authorizing Provider  albuterol (PROVENTIL HFA;VENTOLIN HFA) 108 (90 Base) MCG/ACT inhaler Inhale 2 puffs into the lungs every 6 (six) hours as needed for wheezing or shortness of breath.    [provider]  albuterol (PROVENTIL) (2.5 MG/3ML) 0.083% nebulizer solution Take 2.5 mg by nebulization every 6 (six) hours as needed for wheezing or shortness of breath.  08/15/18   [provider]  amLODipine (NORVASC) 10 MG tablet Take 10 mg by mouth every morning.     [provider]  aspirin EC 81 MG tablet Take 1 tablet (81 mg total) by mouth daily. 03/26/15   Theora Gianotti, NP  atorvastatin (LIPITOR) 40 MG tablet Take 1 tablet (40 mg total) by mouth daily. Please make annual appt for future  refills. Thank you Patient not taking: Reported on 12/03/2018 09/04/18   Theora Gianotti, NP  dronabinol (MARINOL) 2.5 MG capsule Take 2.5 mg by mouth 2 (two) times daily before a meal.    [provider]  furosemide (LASIX) 20 MG tablet Take 20 mg by mouth every morning.     [provider]  levothyroxine (SYNTHROID, LEVOTHROID) 50 MCG tablet Take 50 mcg by mouth daily before breakfast.    [provider]  loratadine (CLARITIN) 10 MG tablet Take 10 mg by mouth every morning.     [provider]  traZODone (DESYREL) 100 MG tablet Take 100 mg by mouth at bedtime.  04/05/18   [provider]      Allergies Patient has no known allergies.   Family History  Problem Relation Age of Onset  . Heart disease Mother   . Heart attack Mother 82  . Hyperlipidemia Mother   . Hypertension Mother   . Heart disease Sister   . Heart attack Sister 33  . Heart disease Brother   . Heart attack Brother 61    Social History Social History   Tobacco Use  . Smoking status: Current Some Day Smoker    Packs/day: 0.25    Years: 70.00    Pack years: 17.50    Types: Cigarettes    Last attempt to quit: 05/29/2014    Years since quitting: 4.5  . Smokeless tobacco: Never Used  Substance Use Topics  . Alcohol use: No  . Drug use: No    Review of Systems  Constitutional:   No fever or chills.  ENT:   No sore throat. No rhinorrhea. Cardiovascular:   No chest pain or syncope. Respiratory:   No dyspnea or cough. Gastrointestinal:   Negative for abdominal pain, vomiting and diarrhea.  Musculoskeletal:   Negative for focal pain or swelling All other systems reviewed and are negative except as documented above in ROS and HPI.  ____________________________________________   PHYSICAL EXAM:  VITAL SIGNS: ED Triage Vitals  Enc Vitals Group     BP 12/06/18 1540 115/79     Pulse Rate 12/06/18 1540 (!) 114     Resp 12/06/18 1540 19     Temp 12/06/18 1540 (!) 97.4 F (36.3 C)     Temp Source 12/06/18 1540 Oral     SpO2 12/06/18 1526 96 %     Weight 12/06/18 1529 140 lb (63.5 kg)     Height 12/06/18 1529 6' (1.829 m)     Head Circumference --      Peak Flow --      Pain Score 12/06/18 1529 8     Pain Loc --      Pain Edu? --      Excl. in [place]? --     Vital signs reviewed, nursing assessments reviewed.   Constitutional:   Alert and oriented to self. Non-toxic appearance. Eyes:   Conjunctivae are normal. EOMI. PERRL. ENT      Head:   Normocephalic and atraumatic.      Nose:   No congestion/rhinnorhea.       Mouth/Throat:   Dry mucous membranes, no pharyngeal erythema. No  peritonsillar mass.       Neck:   No meningismus. Full ROM. Hematological/Lymphatic/Immunilogical:   No cervical lymphadenopathy. Cardiovascular:   Tachycardia heart rate 110. Symmetric bilateral radial and DP pulses.  No murmurs. Cap refill less than 2 seconds. Respiratory:   Normal respiratory effort without tachypnea/retractions. Breath sounds  are clear and equal bilaterally. No wheezes/rales/rhonchi. Gastrointestinal:   Soft and nontender. Non distended. There is no CVA tenderness.  No rebound, rigidity, or guarding.  Musculoskeletal:   Normal range of motion in all extremities. No joint effusions.  No lower extremity tenderness.  No edema. Neurologic:   Normal speech and language.  Motor grossly intact. No acute focal neurologic deficits are appreciated.  Skin:    Skin is warm, dry and intact. No rash noted.  No petechiae, purpura, or bullae.  ____________________________________________    LABS (pertinent positives/negatives) (all labs ordered are listed, but only abnormal results are displayed) Labs Reviewed  CBC WITH DIFFERENTIAL/PLATELET - Abnormal; Notable for the following components:      Result Value   RBC 3.98 (*)    Hemoglobin 10.0 (*)    HCT 31.2 (*)    MCV 78.4 (*)    MCH 25.1 (*)    RDW 17.2 (*)    Platelets 403 (*)    Lymphs Abs 0.4 (*)    All other components within normal limits  COMPREHENSIVE METABOLIC PANEL - Abnormal; Notable for the following components:   Sodium 134 (*)    Potassium 3.0 (*)    Glucose, Bld 103 (*)    Calcium 8.2 (*)    Albumin 2.2 (*)    All other components within normal limits  SARS CORONAVIRUS 2 (HOSPITAL ORDER, [place] [facility])  LIPASE, BLOOD   ____________________________________________   EKG Interpreted by me Atrial fibrillation rate 126, normal axis and intervals.  Right bundle branch block.  No acute ischemic changes.   ____________________________________________    RADIOLOGY  Ct Head W Or  Wo Contrast  Result Date: 12/06/2018 CLINICAL DATA:  Seizure. History of lung cancer. Rule out metastatic disease EXAM: CT HEAD WITHOUT AND WITH CONTRAST TECHNIQUE: Contiguous axial images were obtained from the base of the skull through the vertex without and with intravenous contrast CONTRAST:  95mL OMNIPAQUE IOHEXOL 300 MG/ML  SOLN COMPARISON:  CT head 08/06/2017 FINDINGS: Brain: Cerebral atrophy, most severe in the frontal and temporal lobes. Negative for hydrocephalus. Symmetric white matter hypodensity bilaterally is mildly progressive in the frontal lobes. Negative for acute infarct. Negative for hemorrhage mass or edema. No enhancing metastatic deposits. Vascular: Atherosclerotic calcification. Negative for hyperdense vessel Skull: Negative Sinuses/Orbits: Mild mucosal edema paranasal sinuses. Bilateral cataract surgery. Other: None IMPRESSION: Negative for metastatic disease.  No acute abnormality Severe frontal and temporal atrophy. Progression of chronic microvascular ischemic change in the white matter. Electronically Signed   By: Franchot Gallo M.D.   On: 12/06/2018 18:20    ____________________________________________   PROCEDURES Procedures  ____________________________________________  DIFFERENTIAL DIAGNOSIS   Electrolyte abnormality, dehydration, brain metastasis, intracranial hemorrhage  CLINICAL IMPRESSION / ASSESSMENT AND PLAN / ED COURSE  Medications ordered in the ED: Medications  levETIRAcetam (KEPPRA) IVPB 1000 mg/100 mL premix (has no administration in time range)  sodium chloride 0.9 % bolus 500 mL (0 mLs Intravenous Stopped 12/06/18 1802)  iohexol (OMNIPAQUE) 300 MG/ML solution 75 mL (75 mLs Intravenous Contrast Given 12/06/18 1739)    Pertinent labs & imaging results that were available during my care of the patient were reviewed by me and considered in my medical decision making (see chart for details).  Tajee Renwick was evaluated in Emergency Department on  12/06/2018 for the symptoms described in the history of present illness. He was evaluated in the context of the global COVID-19 pandemic, which necessitated consideration that the patient might be at  risk for infection with the SARS-CoV-2 virus that causes COVID-19. Institutional protocols and algorithms that pertain to the evaluation of patients at risk for COVID-19 are in a state of rapid change based on information released by regulatory bodies including the CDC and federal and state organizations. These policies and algorithms were followed during the patient's care in the ED.   Patient presents with tachycardia, clinically dehydrated in the setting of poor oral intake.  Today he has had 4 observed seizures which is a new issue for him.  He will need to be hospitalized for stabilization of seizures and further work-up.  He is DNR but otherwise has full scope of care , will check labs and CT scan of the head.  COVID screening, plan for admission.  Keppra bolus for initial loading.   ----------------------------------------- 6:56 PM on 12/06/2018 -----------------------------------------  CT head negative for acute hemorrhage or metastatic disease.  Labs overall unremarkable.  Case discussed with hospitalist for further management     ____________________________________________   FINAL CLINICAL IMPRESSION(S) / ED DIAGNOSES    Final diagnoses:  Seizure ([place])  Malignant neoplasm of lung, unspecified laterality, unspecified part of lung [facility])     ED Discharge Orders    None      Portions of this note were generated with dragon dictation software. Dictation errors may occur despite best attempts at proofreading.   Carrie Mew, MD 12/06/18 [phone]

## 2018-12-06 NOTE — ED Notes (Signed)
MD at bedside with family and pt to assessment and update. Pt resting in bed and requesting food and drink.

## 2018-12-06 NOTE — Progress Notes (Signed)
Patient is currently followed at home by [facility] with a hospice diagnosis of malignant lung cancer. He is a DNR code with out of facility DNR in place in the home. He was just admitted to services this morning. Patient's daughter notified the hospice triage nurse that EMS had been called stating that the patient had  3 seizures back to back. [facility] ED RN Amy notified that this patient is followed by hospice.  Flo Shanks BSN, RN, [facility] Liaison [facility]

## 2018-12-06 NOTE — H&P (Signed)
[place] [facility] [place] NAME: Paul Schmitt    MR#:  [id number]  DATE OF BIRTH:  1922-09-14  DATE OF ADMISSION:  12/06/2018  PRIMARY CARE PHYSICIAN: Benson Setting, MD   REQUESTING/REFERRING PHYSICIAN: Dr. Carrie Mew.   CHIEF COMPLAINT:   Chief Complaint  Patient presents with  . Seizures    HISTORY OF PRESENT ILLNESS:  Paul Schmitt  is a 85 y.o. male with a known history of lung cancer, history of carotid artery disease, hypertension, chronic diastolic CHF who presented to the hospital secondary to seizures.  Patient himself cannot recall the events therefore most of the history obtained from the daughter over the phone.  As per the daughter patient has a caretaker at home and the caretaker called her as the patient's eyes rolled backwards and he was not very responsive.  When the daughter arrived to check on the patient he was not able to converse with her and then the daughter noticed the patient having a tonic-clonic seizure which lasted about 2 minutes and the patient was somewhat responsive shortly thereafter but then had another seizure with a total of 3 back-to-back seizures as per the daughter.  Patient himself cannot recall these events.  He does say he has a previous history of seizures but has not had a seizure over the past few years and is currently on no antiepileptics.  Given his recurrent seizures he was brought to the hospital and hospital services were contacted for admission.  Patient CT head is negative for acute pathology.  Patient denies any fevers, chills, cough, nausea, vomiting, abdominal pain, diarrhea or any other associated symptoms.  Patient's COVID-19 test is still pending.  PAST MEDICAL HISTORY:   Past Medical History:  Diagnosis Date  . Asthma   . Carotid arterial disease ([place])    a. 01/2015 CT neck: incidental finding of severe RICA stenosis;  b. 01/2015 Carotid U/S: RICA 94-49%, LICA 67-59%.  . Diastolic  dysfunction    a. echo 08/2014: EF 60-65%, nl wall motion, GR1DD, mildly dilated LA, nl PASP  . Essential hypertension   . Normal coronary arteries    a. cardiac cath 1989: normal coronary arteries; b. 09/2014 nuclear stress test: no significant ischemia or scar, EF 49%.  . Palpitations    a. 09/2014 Event Monitor: sinus rhythm, rare pvc's, no notable arrhythmia.  . Parotid mass    a. 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  . Prostate cancer ([place])   . RBBB   . Syncope   . Vertigo     PAST SURGICAL HISTORY:   Past Surgical History:  Procedure Laterality Date  . HERNIA REPAIR      SOCIAL HISTORY:   Social History   Tobacco Use  . Smoking status: Current Some Day Smoker    Packs/day: 0.25    Years: 70.00    Pack years: 17.50    Types: Cigarettes    Last attempt to quit: 05/29/2014    Years since quitting: 4.5  . Smokeless tobacco: Never Used  Substance Use Topics  . Alcohol use: No    FAMILY HISTORY:   Family History  Problem Relation Age of Onset  . Heart disease Mother   . Heart attack Mother 4  . Hyperlipidemia Mother   . Hypertension Mother   . Heart disease Sister   . Heart attack Sister 16  . Heart disease Brother   . Heart attack  Brother 42    DRUG ALLERGIES:  No Known Allergies  REVIEW OF SYSTEMS:   Review of Systems  Constitutional: Negative for fever and weight loss.  HENT: Negative for congestion, nosebleeds and tinnitus.   Eyes: Negative for blurred vision, double vision and redness.  Respiratory: Negative for cough, hemoptysis and shortness of breath.   Cardiovascular: Negative for chest pain, orthopnea, leg swelling and PND.  Gastrointestinal: Negative for abdominal pain, diarrhea, melena, nausea and vomiting.  Genitourinary: Negative for dysuria, hematuria and urgency.  Musculoskeletal: Negative for falls and joint pain.  Neurological: Negative for dizziness, tingling, sensory change, focal weakness,  seizures, weakness and headaches.  Endo/Heme/Allergies: Negative for polydipsia. Does not bruise/bleed easily.  Psychiatric/Behavioral: Negative for depression and memory loss. The patient is not nervous/anxious.     MEDICATIONS AT HOME:   Prior to Admission medications   Medication Sig Start Date End Date Taking? Authorizing Provider  albuterol (PROVENTIL HFA;VENTOLIN HFA) 108 (90 Base) MCG/ACT inhaler Inhale 2 puffs into the lungs every 6 (six) hours as needed for wheezing or shortness of breath.   Yes [provider]  amLODipine (NORVASC) 10 MG tablet Take 10 mg by mouth every morning.    Yes [provider]  aspirin EC 81 MG tablet Take 1 tablet (81 mg total) by mouth daily. 03/26/15  Yes Theora Gianotti, NP  dronabinol (MARINOL) 2.5 MG capsule Take 2.5 mg by mouth 2 (two) times daily before a meal.   Yes [provider]  furosemide (LASIX) 20 MG tablet Take 20 mg by mouth every morning.    Yes [provider]  levothyroxine (SYNTHROID, LEVOTHROID) 50 MCG tablet Take 50 mcg by mouth daily before breakfast.   Yes [provider]  loratadine (CLARITIN) 10 MG tablet Take 10 mg by mouth every morning.    Yes [provider]  traZODone (DESYREL) 100 MG tablet Take 100 mg by mouth at bedtime.  04/05/18  Yes [provider]  albuterol (PROVENTIL) (2.5 MG/3ML) 0.083% nebulizer solution Take 2.5 mg by nebulization every 6 (six) hours as needed for wheezing or shortness of breath.  08/15/18   [provider]      VITAL SIGNS:  Blood pressure 128/81, pulse (!) 114, temperature (!) 97.4 F (36.3 C), temperature source Oral, resp. rate 14, height 6' (1.829 m), weight 63.5 kg, SpO2 99 %.  PHYSICAL EXAMINATION:  Physical Exam  GENERAL:  85 y.o.-year-old patient lying in the bed in NAD.  EYES: Pupils equal, round, reactive to light and accommodation. No scleral icterus. Extraocular muscles intact.  HEENT: Head  atraumatic, normocephalic. Oropharynx and nasopharynx clear. No oropharyngeal erythema, moist oral mucosa  NECK:  Supple, no jugular venous distention. No thyroid enlargement, no tenderness.  LUNGS: Normal breath sounds bilaterally, no wheezing, rales, rhonchi. No use of accessory muscles of respiration.  CARDIOVASCULAR: S1, S2 RRR. No murmurs, rubs, gallops, clicks.  ABDOMEN: Soft, nontender, nondistended. Bowel sounds present. No organomegaly or mass.  EXTREMITIES: No pedal edema, cyanosis, or clubbing. + 2 pedal & radial pulses b/l.   NEUROLOGIC: Cranial nerves II through XII are intact. No focal Motor or sensory deficits appreciated b/l. Globally weak.  PSYCHIATRIC: The patient is alert and oriented x 3.  SKIN: No obvious rash, lesion, or ulcer.   LABORATORY PANEL:   CBC Recent Labs  Lab 12/06/18 1631  WBC 7.2  HGB 10.0*  HCT 31.2*  PLT 403*   ------------------------------------------------------------------------------------------------------------------  Chemistries  Recent Labs  Lab 12/06/18 1631  NA  134*  K 3.0*  CL 98  CO2 27  GLUCOSE 103*  BUN 11  CREATININE 0.77  CALCIUM 8.2*  AST 16  ALT 10  ALKPHOS 58  BILITOT 0.6   ------------------------------------------------------------------------------------------------------------------  Cardiac Enzymes No results for input(s): TROPONINI in the last 168 hours. ------------------------------------------------------------------------------------------------------------------  RADIOLOGY:  Ct Head W Or Wo Contrast  Result Date: 12/06/2018 CLINICAL DATA:  Seizure. History of lung cancer. Rule out metastatic disease EXAM: CT HEAD WITHOUT AND WITH CONTRAST TECHNIQUE: Contiguous axial images were obtained from the base of the skull through the vertex without and with intravenous contrast CONTRAST:  31mL OMNIPAQUE IOHEXOL 300 MG/ML  SOLN COMPARISON:  CT head 08/06/2017 FINDINGS: Brain: Cerebral atrophy, most severe in  the frontal and temporal lobes. Negative for hydrocephalus. Symmetric white matter hypodensity bilaterally is mildly progressive in the frontal lobes. Negative for acute infarct. Negative for hemorrhage mass or edema. No enhancing metastatic deposits. Vascular: Atherosclerotic calcification. Negative for hyperdense vessel Skull: Negative Sinuses/Orbits: Mild mucosal edema paranasal sinuses. Bilateral cataract surgery. Other: None IMPRESSION: Negative for metastatic disease.  No acute abnormality Severe frontal and temporal atrophy. Progression of chronic microvascular ischemic change in the white matter. Electronically Signed   By: Franchot Gallo M.D.   On: 12/06/2018 18:20     IMPRESSION AND PLAN:   85 y.o. male with a known history of lung cancer, history of carotid artery disease, hypertension, chronic diastolic CHF who presented to the hospital secondary to seizures.  1.  Seizures-patient had 3 seizures today.  He does have a previous history of seizures but currently is not on any antiepileptics.  As per the patient's daughter he does not notify them when he does have a seizure. - Patient has been loaded with IV Keppra and will continue to monitor for now. -CT head was negative for acute pathology or metastatic disease. - We will get neurology consult in a.m.  2.  Hypokalemia-will replace potassium orally and repeat level in the morning. -Check magnesium level.  3.  History of chronic diastolic CHF-clinically patient is not in congestive heart failure -Continue Lasix, Norvasc.  4.  Essential hypertension-continue Norvasc.  5.  History of lung cancer- patient is status post radiation treatment and currently is refusing any further treatment.  He is followed by hospice at home.  Continue Marinol.  6.  Hypothyroidism-continue Synthroid.    All the records are reviewed and case discussed with ED provider. Management plans discussed with the patient, family and they are in agreement.   CODE STATUS: DNR  TOTAL TIME TAKING CARE OF THIS PATIENT: 40 minutes.    Henreitta Leber M.D on 12/06/2018 at 7:18 PM  Between 7am to 6pm - Pager - [phone]  After 6pm go to [email or web address] - password [email or web address] [facility]  Office  [phone]  CC: Primary care physician; Benson Setting, MD

## 2018-12-07 ENCOUNTER — Observation Stay

## 2018-12-07 DIAGNOSIS — R569 Unspecified convulsions: Secondary | ICD-10-CM | POA: Diagnosis not present

## 2018-12-07 LAB — BASIC METABOLIC PANEL
Anion gap: 8 (ref 5–15)
BUN: 10 mg/dL (ref 8–23)
CO2: 29 mmol/L (ref 22–32)
Calcium: 8.3 mg/dL — ABNORMAL LOW (ref 8.9–10.3)
Chloride: 98 mmol/L (ref 98–111)
Creatinine, Ser: 0.7 mg/dL (ref 0.61–1.24)
GFR calc Af Amer: >60 mL/min (ref >60)
GFR calc non Af Amer: >60 mL/min (ref >60)
Glucose, Bld: 87 mg/dL (ref 70–99)
Potassium: 3.4 mmol/L — ABNORMAL LOW (ref 3.5–5.1)
Sodium: 135 mmol/L (ref 135–145)

## 2018-12-07 MED ORDER — LEVETIRACETAM 500 MG PO TABS
500.0000 mg | ORAL_TABLET | Freq: Two times a day (BID) | ORAL | Status: DC
  Administered 2018-12-07 – 2018-12-08 (×3): 500 mg via ORAL
  Filled 2018-12-07 (×5): qty 1

## 2018-12-07 MED ORDER — GADOBUTROL 1 MMOL/ML IV SOLN
6.0000 mL | Freq: Once | INTRAVENOUS | Status: AC | PRN
  Administered 2018-12-07: 20:00:00 6 mL via INTRAVENOUS

## 2018-12-07 MED ORDER — POTASSIUM CHLORIDE CRYS ER 20 MEQ PO TBCR
40.0000 meq | EXTENDED_RELEASE_TABLET | Freq: Once | ORAL | Status: AC
  Administered 2018-12-07: 40 meq via ORAL
  Filled 2018-12-07: qty 2

## 2018-12-07 NOTE — Progress Notes (Signed)
[place] [facility] [place] NAME: Paul Schmitt    MR#:  [id number]  DATE OF BIRTH:  Mar 15, 1923  SUBJECTIVE:  CHIEF COMPLAINT: Patient denies any other episodes of seizures.  Resting comfortably Sees a oncologist at [facility] for his lung cancer  REVIEW OF SYSTEMS:  CONSTITUTIONAL: No fever, fatigue or weakness.  EYES: No blurred or double vision.  EARS, NOSE, AND THROAT: No tinnitus or ear pain.  RESPIRATORY: No cough, shortness of breath, wheezing or hemoptysis.  CARDIOVASCULAR: No chest pain, orthopnea, edema.  GASTROINTESTINAL: No nausea, vomiting, diarrhea or abdominal pain.  GENITOURINARY: No dysuria, hematuria.  ENDOCRINE: No polyuria, nocturia,  HEMATOLOGY: No anemia, easy bruising or bleeding SKIN: No rash or lesion. MUSCULOSKELETAL: No joint pain or arthritis.   NEUROLOGIC: No tingling, numbness, weakness.  PSYCHIATRY: No anxiety or depression.   DRUG ALLERGIES:  No Known Allergies  VITALS:  Blood pressure 119/78, pulse 80, temperature (!) 97.4 F (36.3 C), temperature source Oral, resp. rate 16, height 6' (1.829 m), weight 63.5 kg, SpO2 100 %.  PHYSICAL EXAMINATION:  GENERAL:  85 y.o.-year-old patient lying in the bed with no acute distress.  EYES: Pupils equal, round, reactive to light and accommodation. No scleral icterus. Extraocular muscles intact.  HEENT: Head atraumatic, normocephalic. Oropharynx and nasopharynx clear.  NECK:  Supple, no jugular venous distention. No thyroid enlargement, no tenderness.  LUNGS: Normal breath sounds bilaterally, no wheezing, rales,rhonchi or crepitation. No use of accessory muscles of respiration.  CARDIOVASCULAR: S1, S2 normal. No murmurs, rubs, or gallops.  ABDOMEN: Soft, nontender, nondistended. Bowel sounds present. No organomegaly or mass.  EXTREMITIES: No pedal edema, cyanosis, or clubbing.  NEUROLOGIC: Cranial nerves II through XII are intact. Muscle strength weak in all extremities.  Sensation intact. Gait not checked.  PSYCHIATRIC: The patient is alert and oriented x 3.  SKIN: No obvious rash, lesion, or ulcer.    LABORATORY PANEL:   CBC Recent Labs  Lab 12/06/18 1631  WBC 7.2  HGB 10.0*  HCT 31.2*  PLT 403*   ------------------------------------------------------------------------------------------------------------------  Chemistries  Recent Labs  Lab 12/06/18 1631 12/07/18 0625  NA 134* 135  K 3.0* 3.4*  CL 98 98  CO2 27 29  GLUCOSE 103* 87  BUN 11 10  CREATININE 0.77 0.70  CALCIUM 8.2* 8.3*  AST 16  --   ALT 10  --   ALKPHOS 58  --   BILITOT 0.6  --    ------------------------------------------------------------------------------------------------------------------  Cardiac Enzymes No results for input(s): TROPONINI in the last 168 hours. ------------------------------------------------------------------------------------------------------------------  RADIOLOGY:  Ct Head W Or Wo Contrast  Result Date: 12/06/2018 CLINICAL DATA:  Seizure. History of lung cancer. Rule out metastatic disease EXAM: CT HEAD WITHOUT AND WITH CONTRAST TECHNIQUE: Contiguous axial images were obtained from the base of the skull through the vertex without and with intravenous contrast CONTRAST:  34mL OMNIPAQUE IOHEXOL 300 MG/ML  SOLN COMPARISON:  CT head 08/06/2017 FINDINGS: Brain: Cerebral atrophy, most severe in the frontal and temporal lobes. Negative for hydrocephalus. Symmetric white matter hypodensity bilaterally is mildly progressive in the frontal lobes. Negative for acute infarct. Negative for hemorrhage mass or edema. No enhancing metastatic deposits. Vascular: Atherosclerotic calcification. Negative for hyperdense vessel Skull: Negative Sinuses/Orbits: Mild mucosal edema paranasal sinuses. Bilateral cataract surgery. Other: None IMPRESSION: Negative for metastatic disease.  No acute abnormality Severe frontal and temporal atrophy. Progression of chronic  microvascular ischemic change in the white matter. Electronically Signed   By: Franchot Gallo  M.D.   On: 12/06/2018 18:20    EKG:   Orders placed or performed during the hospital encounter of 12/06/18  . EKG 12-Lead  . EKG 12-Lead    ASSESSMENT AND PLAN:   85 y.o. male with a known history of lung cancer, history of carotid artery disease, hypertension, chronic diastolic CHF who presented to the hospital secondary to seizures.  1.  Seizures- patient had 3 seizures 12/06/18.  He does have a previous history of seizures but currently is not on any antiepileptics.  As per the patient's daughter he does not notify them when he does have a seizure. - Patient has been loaded with IV Keppra and will continue Keppra 500 mg twice a day -CT head was negative for acute pathology or metastatic disease. -EEG, MRI -  Neurology  is following  2.  Hypokalemia-will replace potassium orally and repeat level in the morning. -Check magnesium level.  3.  History of chronic diastolic CHF-clinically patient is not in congestive heart failure -Continue Lasix, Norvasc.  4.  Essential hypertension-continue Norvasc.  5.  History of lung cancer- patient is status post radiation treatment and currently is refusing any further treatment.  He is followed by hospice at home.  Continue Marinol.  6.  Hypothyroidism-continue Synthroid.   All the records are reviewed and case discussed with Care Management/Social Workerr. Management plans discussed with the patient, family and they are in agreement.  CODE STATUS: DNR   TOTAL TIME TAKING CARE OF THIS PATIENT: 36 minutes.   POSSIBLE D/C IN 1-2  DAYS, DEPENDING ON CLINICAL CONDITION.  Note: This dictation was prepared with Dragon dictation along with smaller phrase technology. Any transcriptional errors that result from this process are unintentional.   Nicholes Mango M.D on 12/07/2018 at 3:15 PM  Between 7am to 6pm - Pager - [phone] After 6pm go to  [email or web address] - password [email or web address]  Office  [phone]  CC: Primary care physician; Benson Setting, MD

## 2018-12-07 NOTE — Consult Note (Signed)
Reason for Consult:Seizure Referring Physician: Gouru  CC: Seizure  HPI: Paul Schmitt is an 85 y.o. male with a history of lung cancer, carotid artery disease and stroke on ASA (off Xarelto due to cost) who presents after a witnessed seizure.  Patient himself cannot recall the events therefore most of the history obtained from the chart.  Caretaker noted patient's eyes rolled backwards and he was not very responsive.  When the daughter arrived to check on the patient he was not able to converse with her and then the daughter noticed the patient having a tonic-clonic seizure which lasted about 2 minutes and the patient was somewhat responsive shortly thereafter but then had another seizure with a total of 3 seizures witnessed.  Patient reports he had seizures many years ago that were attributed to a medication.  This medication was discontinued and he had no further seizures.   Past Medical History:  Diagnosis Date  . Asthma   . Carotid arterial disease ([place])    a. 01/2015 CT neck: incidental finding of severe RICA stenosis;  b. 01/2015 Carotid U/S: RICA 74-25%, LICA 95-63%.  . Diastolic dysfunction    a. echo 08/2014: EF 60-65%, nl wall motion, GR1DD, mildly dilated LA, nl PASP  . Essential hypertension   . Normal coronary arteries    a. cardiac cath 1989: normal coronary arteries; b. 09/2014 nuclear stress test: no significant ischemia or scar, EF 49%.  . Palpitations    a. 09/2014 Event Monitor: sinus rhythm, rare pvc's, no notable arrhythmia.  . Parotid mass    a. 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  . Prostate cancer ([place])   . RBBB   . Syncope   . Vertigo     Past Surgical History:  Procedure Laterality Date  . HERNIA REPAIR      Family History  Problem Relation Age of Onset  . Heart disease Mother   . Heart attack Mother 55  . Hyperlipidemia Mother   . Hypertension Mother   . Heart disease Sister   . Heart attack Sister 59  . Heart  disease Brother   . Heart attack Brother 66    Social History:  reports that he quit smoking about 4 years ago. His smoking use included cigarettes. He has a 17.50 pack-year smoking history. He has never used smokeless tobacco. He reports that he does not drink alcohol or use drugs.  No Known Allergies  Medications:  I have reviewed the patient's current medications. Prior to Admission:  Medications Prior to Admission  Medication Sig Dispense Refill Last Dose  . albuterol (PROVENTIL HFA;VENTOLIN HFA) 108 (90 Base) MCG/ACT inhaler Inhale 2 puffs into the lungs every 6 (six) hours as needed for wheezing or shortness of breath.   prn at prn  . amLODipine (NORVASC) 10 MG tablet Take 10 mg by mouth every morning.    12/05/2018 at Unknown time  . aspirin EC 81 MG tablet Take 1 tablet (81 mg total) by mouth daily. 90 tablet 3 12/05/2018 at Unknown time  . dronabinol (MARINOL) 2.5 MG capsule Take 2.5 mg by mouth 2 (two) times daily before a meal.   12/05/2018 at Unknown time  . furosemide (LASIX) 20 MG tablet Take 20 mg by mouth every morning.    12/05/2018 at Unknown time  . levothyroxine (SYNTHROID, LEVOTHROID) 50 MCG tablet Take 50 mcg by mouth daily before breakfast.   12/05/2018 at Unknown time  . loratadine (CLARITIN) 10 MG  tablet Take 10 mg by mouth every morning.    12/05/2018 at Unknown time  . traZODone (DESYREL) 100 MG tablet Take 100 mg by mouth at bedtime.    12/05/2018 at Unknown time  . albuterol (PROVENTIL) (2.5 MG/3ML) 0.083% nebulizer solution Take 2.5 mg by nebulization every 6 (six) hours as needed for wheezing or shortness of breath.    prn at prn   Scheduled: . amLODipine  10 mg Oral q morning - 10a  . aspirin EC  81 mg Oral Daily  . dronabinol  2.5 mg Oral BID AC  . enoxaparin (LOVENOX) injection  40 mg Subcutaneous Q24H  . furosemide  20 mg Oral q morning - 10a  . levothyroxine  50 mcg Oral Q0600  . loratadine  10 mg Oral q morning - 10a  . traZODone  100 mg Oral QHS     ROS: History obtained from the patient  General ROS: negative for - chills, fatigue, fever, night sweats, weight gain or weight loss Psychological ROS: not eating well Ophthalmic ROS: negative for - blurry vision, double vision, eye pain or loss of vision ENT ROS: negative for - epistaxis, nasal discharge, oral lesions, sore throat, tinnitus or vertigo Allergy and Immunology ROS: negative for - hives or itchy/watery eyes Hematological and Lymphatic ROS: negative for - bleeding problems, bruising or swollen lymph nodes Endocrine ROS: negative for - galactorrhea, hair pattern changes, polydipsia/polyuria or temperature intolerance Respiratory ROS: negative for - cough, hemoptysis, shortness of breath or wheezing Cardiovascular ROS: negative for - chest pain, dyspnea on exertion, edema or irregular heartbeat Gastrointestinal ROS: negative for - abdominal pain, diarrhea, hematemesis, nausea/vomiting or stool incontinence Genito-Urinary ROS: negative for - dysuria, hematuria, incontinence or urinary frequency/urgency Musculoskeletal ROS: negative for - joint swelling or muscular weakness Neurological ROS: as noted in HPI, left hemiparesis from previous stroke Dermatological ROS: negative for rash and skin lesion changes  Physical Examination: Blood pressure 119/78, pulse 80, temperature (!) 97.4 F (36.3 C), temperature source Oral, resp. rate 16, height 6' (1.829 m), weight 63.5 kg, SpO2 100 %.  HEENT-  Normocephalic, no lesions, without obvious abnormality.  Normal external eye and conjunctiva.  Normal TM's bilaterally.  Normal auditory canals and external ears. Normal external nose, mucus membranes and septum.  Normal pharynx. Cardiovascular- S1, S2 normal, pulses palpable throughout   Lungs- chest clear, no wheezing, rales, normal symmetric air entry Abdomen- soft, non-tender; bowel sounds normal; no masses,  no organomegaly Extremities- BLE edema Lymph-no adenopathy  palpable Musculoskeletal-no joint tenderness, deformity or swelling Skin-warm and dry, no hyperpigmentation, vitiligo, or suspicious lesions  Neurological Examination   Mental Status: Alert, oriented, thought content appropriate.  Speech fluent without evidence of aphasia.  Some dysarthria noted.  Able to follow 3 step commands without difficulty. Cranial Nerves: II: Visual fields grossly normal, pupils equal, round, reactive to light and accommodation III,IV, VI: ptosis not present, extra-ocular motions intact bilaterally V,VII: left facial droop, facial light touch sensation normal bilaterally VIII: hearing normal bilaterally IX,X: gag reflex present XI: bilateral shoulder shrug XII: midline tongue extension Motor: Right : Upper extremity   5/5    Left:     Upper extremity   4/5  Lower extremity   4-/5     Lower extremity   3+/5 Tone and bulk:normal tone throughout; no atrophy noted Sensory: Pinprick and light touch intact throughout, bilaterally Deep Tendon Reflexes: Symmetric throughout Plantars: Right: mute   Left: upgoing Cerebellar: Normal finger-to-nose testing bilaterally Gait: not tested due to  safety concerns  Laboratory Studies:   Basic Metabolic Panel: Recent Labs  Lab 12/03/18 1409 12/04/18 0251 12/06/18 1631 12/07/18 0625  NA 131* 129* 134* 135  K 3.7 3.6 3.0* 3.4*  CL 97* 94* 98 98  CO2 24 21* 27 29  GLUCOSE 104* 93 103* 87  BUN 8 8 11 10   CREATININE 0.85 0.81 0.77 0.70  CALCIUM 8.5* 8.5* 8.2* 8.3*    Liver Function Tests: Recent Labs  Lab 12/03/18 1409 12/06/18 1631  AST 19 16  ALT 12 10  ALKPHOS 67 58  BILITOT 0.6 0.6  PROT 7.6 7.1  ALBUMIN 2.0* 2.2*   Recent Labs  Lab 12/06/18 1631  LIPASE 29   No results for input(s): AMMONIA in the last 168 hours.  CBC: Recent Labs  Lab 12/03/18 1409 12/04/18 0251 12/06/18 1631  WBC 6.7 6.6 7.2  NEUTROABS 5.6  --  6.2  HGB 9.6* 9.8* 10.0*  HCT 30.9* 31.2* 31.2*  MCV 80.9 79.2* 78.4*  PLT  340 404* 403*    Cardiac Enzymes: No results for input(s): CKTOTAL, CKMB, CKMBINDEX, TROPONINI in the last 168 hours.  BNP: Invalid input(s): POCBNP  CBG: No results for input(s): GLUCAP in the last 168 hours.  Microbiology: Results for orders placed or performed during the hospital encounter of 12/06/18  SARS Coronavirus 2 [facility] order, Performed in [facility] hospital lab) Nasopharyngeal Nasopharyngeal Swab     Status: None   Collection Time: 12/06/18  6:02 PM   Specimen: Nasopharyngeal Swab  Result Value Ref Range Status   SARS Coronavirus 2 NEGATIVE NEGATIVE Final    Comment: (NOTE) If result is NEGATIVE SARS-CoV-2 target nucleic acids are NOT DETECTED. The SARS-CoV-2 RNA is generally detectable in upper and lower  respiratory specimens during the acute phase of infection. The lowest  concentration of SARS-CoV-2 viral copies this assay can detect is 250  copies / mL. A negative result does not preclude SARS-CoV-2 infection  and should not be used as the sole basis for treatment or other  patient management decisions.  A negative result may occur with  improper specimen collection / handling, submission of specimen other  than nasopharyngeal swab, presence of viral mutation(s) within the  areas targeted by this assay, and inadequate number of viral copies  (<250 copies / mL). A negative result must be combined with clinical  observations, patient history, and epidemiological information. If result is POSITIVE SARS-CoV-2 target nucleic acids are DETECTED. The SARS-CoV-2 RNA is generally detectable in upper and lower  respiratory specimens dur ing the acute phase of infection.  Positive  results are indicative of active infection with SARS-CoV-2.  Clinical  correlation with patient history and other diagnostic information is  necessary to determine patient infection status.  Positive results do  not rule out bacterial infection or co-infection with other viruses. If  result is PRESUMPTIVE POSTIVE SARS-CoV-2 nucleic acids MAY BE PRESENT.   A presumptive positive result was obtained on the submitted specimen  and confirmed on repeat testing.  While 2019 novel coronavirus  (SARS-CoV-2) nucleic acids may be present in the submitted sample  additional confirmatory testing may be necessary for epidemiological  and / or clinical management purposes  to differentiate between  SARS-CoV-2 and other Sarbecovirus currently known to infect humans.  If clinically indicated additional testing with an alternate test  methodology [phone]) is advised. The SARS-CoV-2 RNA is generally  detectable in upper and lower respiratory sp ecimens during the acute  phase of infection. The expected  result is Negative. Fact Sheet for Patients:  StrictlyIdeas.no Fact Sheet for Healthcare Providers: BankingDealers.co.za This test is not yet approved or cleared by the [place] FDA and has been authorized for detection and/or diagnosis of SARS-CoV-2 by FDA under an Emergency Use Authorization (EUA).  This EUA will remain in effect (meaning this test can be used) for the duration of the COVID-19 declaration under Section 564(b)(1) of the Act, 21 U.S.C. section 360bbb-3(b)(1), unless the authorization is terminated or revoked sooner. Performed at [facility], [address] [place], [place] [address]     Coagulation Studies: No results for input(s): LABPROT, INR in the last 72 hours.  Urinalysis:  Recent Labs  Lab 12/03/18 2040  COLORURINE YELLOW  LABSPEC 1.036*  PHURINE 6.0  GLUCOSEU NEGATIVE  HGBUR NEGATIVE  BILIRUBINUR NEGATIVE  KETONESUR 5*  PROTEINUR NEGATIVE  NITRITE NEGATIVE  LEUKOCYTESUR NEGATIVE    Lipid Panel:     Component Value Date/Time   CHOL 189 03/26/2015 0908   TRIG 55 03/26/2015 0908   HDL 60 03/26/2015 0908   CHOLHDL 3.2 03/26/2015 0908   LDLCALC 118 (H) 03/26/2015 0908     HgbA1C: No results found for: HGBA1C  Urine Drug Screen:  No results found for: LABOPIA, COCAINSCRNUR, LABBENZ, AMPHETMU, THCU, LABBARB  Alcohol Level: No results for input(s): ETH in the last 168 hours.  Other results: EKG: atrial fibrillation, rate 126bpm.  Imaging: Ct Head W Or Wo Contrast  Result Date: 12/06/2018 CLINICAL DATA:  Seizure. History of lung cancer. Rule out metastatic disease EXAM: CT HEAD WITHOUT AND WITH CONTRAST TECHNIQUE: Contiguous axial images were obtained from the base of the skull through the vertex without and with intravenous contrast CONTRAST:  57mL OMNIPAQUE IOHEXOL 300 MG/ML  SOLN COMPARISON:  CT head 08/06/2017 FINDINGS: Brain: Cerebral atrophy, most severe in the frontal and temporal lobes. Negative for hydrocephalus. Symmetric white matter hypodensity bilaterally is mildly progressive in the frontal lobes. Negative for acute infarct. Negative for hemorrhage mass or edema. No enhancing metastatic deposits. Vascular: Atherosclerotic calcification. Negative for hyperdense vessel Skull: Negative Sinuses/Orbits: Mild mucosal edema paranasal sinuses. Bilateral cataract surgery. Other: None IMPRESSION: Negative for metastatic disease.  No acute abnormality Severe frontal and temporal atrophy. Progression of chronic microvascular ischemic change in the white matter. Electronically Signed   By: Franchot Gallo M.D.   On: 12/06/2018 18:20     Assessment/Plan: 85 year old male with a history of lung cancer, carotid artery disease and stroke on ASA (off Xarelto due to cost) who presents after multiple witnessed seizures.  Head CT reviewed and shows no acute changes but patient with risk factors for seizure.  Patient loaded with Keppra in the ED.  Recommendations: 1. EEG 2. MRI of the brain with and without contrast 3. Seizure precautions 4. Would continue maintenance Keppra at 500mg  BID   Alexis Goodell, MD Neurology [phone] 12/07/2018, 11:55 AM

## 2018-12-07 NOTE — TOC Initial Note (Signed)
Transition of Care [facility]) - Initial/Assessment Note    Patient Details  Name: Paul Schmitt MRN: [id number] Date of Birth: 1922/08/27  [facility] CM/SW Contact:    Shelbie Hutching, RN Phone Number: 12/07/2018, 3:08 PM  Clinical Narrative:                 Patient admitted with seizure activity and patient will have an MRI.  Patient's daughter is at the bedside.  Patient is hospice patient with [facility], Flo Shanks is aware of admission.  Patient is from home and lives alone but has caregivers that come in.  Patient will need EMS transport at discharge.   Expected Discharge Plan: Home w Hospice Care Barriers to Discharge: Continued Medical Work up   Patient Goals and CMS Choice        Expected Discharge Plan and Services Expected Discharge Plan: [place]   Discharge Planning Services: CM Consult   Living arrangements for the past 2 months: Single Family Home                                      Prior Living Arrangements/Services Living arrangements for the past 2 months: Single Family Home Lives with:: Self Patient language and need for interpreter reviewed:: No Do you feel safe going back to the place where you live?: Yes      Need for Family Participation in Patient Care: Yes (Comment)(hospice patient) Care giver support system in place?: Yes (comment)(daughter)   Criminal Activity/Legal Involvement Pertinent to Current Situation/Hospitalization: No - Comment as needed  Activities of Daily Living Home Assistive Devices/Equipment: Oxygen, Cane (specify quad or straight), Walker (specify type) ADL Screening (condition at time of admission) Patient's cognitive ability adequate to safely complete daily activities?: Yes Is the patient deaf or have difficulty hearing?: No Does the patient have difficulty seeing, even when wearing glasses/contacts?: No Does the patient have difficulty concentrating, remembering, or making  decisions?: No Patient able to express need for assistance with ADLs?: Yes Does the patient have difficulty dressing or bathing?: Yes Independently performs ADLs?: No Communication: Independent Dressing (OT): Independent Grooming: Needs assistance Is this a change from baseline?: Pre-admission baseline Feeding: Independent Bathing: Needs assistance Is this a change from baseline?: Pre-admission baseline Toileting: Needs assistance Is this a change from baseline?: Pre-admission baseline In/Out Bed: Needs assistance Is this a change from baseline?: Pre-admission baseline Walks in Home: Needs assistance Is this a change from baseline?: Pre-admission baseline Does the patient have difficulty walking or climbing stairs?: Yes Weakness of Legs: Both Weakness of Arms/Hands: None  Permission Sought/Granted Permission sought to share information with : Case Manager, Other (comment), Family Supports Permission granted to share information with : Yes, Verbal Permission Granted     Permission granted to share info w AGENCY: [place] granted to share info w Relationship: Daughter Wynter     Emotional Assessment Appearance:: Appears stated age Attitude/Demeanor/Rapport: Engaged Affect (typically observed): Accepting Orientation: : Oriented to Self, Oriented to Place, Oriented to  Time, Oriented to Situation Alcohol / Substance Use: Not Applicable Psych Involvement: No (comment)  Admission diagnosis:  Seizure ([place]) [R56.9] Malignant neoplasm of lung, unspecified laterality, unspecified part of lung ([place]) [C34.90] Patient Active Problem List   Diagnosis Date Noted  . Seizures ([place]) 12/06/2018  . Malignant neoplasm of lung ([place])   . Goals of care, counseling/discussion   .  Palliative care by specialist   . Pleural effusion on left   . AMS (altered mental status) 12/03/2018  . Venous stasis 11/02/2018  . Atrial fibrillation ([place]) 07/24/2018  . History of stroke  07/24/2018  . Dyspnea on exertion 07/24/2018  . Palpitations   . Carotid arterial disease ([place])   . Uncontrolled hypertension   . Vertigo   . Diastolic dysfunction   . Syncope   . Intermittent palpitations 08/26/2014  . Chest tightness 08/26/2014  . Fatigue - with exercise intolerance 08/26/2014  . Bradycardia 08/26/2014   PCP:  Benson Setting, MD Pharmacy:   [place], [place] [address] Phone: [phone] Fax: [phone]     Social Determinants of Health (SDOH) Interventions    Readmission Risk Interventions No flowsheet data found.

## 2018-12-07 NOTE — Care Management Obs Status (Signed)
[facility] NOTIFICATION   Patient Details  Name: Larron Armor MRN: [id number] Date of Birth: [birth year]   Medicare Observation Status Notification Given:  Yes    Shelbie Hutching, RN 12/07/2018, 3:06 PM

## 2018-12-07 NOTE — Progress Notes (Signed)
Visit made. Patient seen reclining in bed, al;ert and oriented x 4 able to converse and recall events of yesterday. No dyspnea noted, denied pain. Chart notes reviewed, patient was able to take his oral medications and ate some breakfast with assistance. Patient reports he no longer walks at home and will require EMS transport at discharge. Currently no anti seizure medications ordered, patient did receive IV Keppra and IV fluids in the ED yesterday afternoon. Patient sitting up in bed eating a banana at end of visit. Patient did report that he no longer walks at home and that he needs the assist of 2 to get up to the bedside commode, this information shared with staff RN staff RN [facility] and [facility] as patient will require EMS transport at discharge. Will continue to follow and update hospice team. Flo Shanks BSN, RN, [facility] [phone]

## 2018-12-08 LAB — BASIC METABOLIC PANEL
Anion gap: 9 (ref 5–15)
BUN: 9 mg/dL (ref 8–23)
CO2: 28 mmol/L (ref 22–32)
Calcium: 8.4 mg/dL — ABNORMAL LOW (ref 8.9–10.3)
Chloride: 98 mmol/L (ref 98–111)
Creatinine, Ser: 0.77 mg/dL (ref 0.61–1.24)
GFR calc Af Amer: >60 mL/min (ref >60)
GFR calc non Af Amer: >60 mL/min (ref >60)
Glucose, Bld: 98 mg/dL (ref 70–99)
Potassium: 3.7 mmol/L (ref 3.5–5.1)
Sodium: 135 mmol/L (ref 135–145)

## 2018-12-08 LAB — MAGNESIUM: Magnesium: 1.6 mg/dL — ABNORMAL LOW (ref 1.7–2.4)

## 2018-12-08 MED ORDER — SODIUM CHLORIDE 0.9 % IV SOLN
INTRAVENOUS | Status: DC | PRN
  Administered 2018-12-08: 250 mL via INTRAVENOUS

## 2018-12-08 MED ORDER — LEVETIRACETAM 500 MG PO TABS: 500.0000 mg | ORAL_TABLET | Freq: Two times a day (BID) | ORAL | 1 refills | Status: AC

## 2018-12-08 MED ORDER — MAGNESIUM SULFATE 2 GM/50ML IV SOLN
2.0000 g | Freq: Once | INTRAVENOUS | Status: AC
  Administered 2018-12-08: 10:00:00 2 g via INTRAVENOUS
  Filled 2018-12-08: qty 50

## 2018-12-08 NOTE — Plan of Care (Signed)

## 2018-12-08 NOTE — TOC Transition Note (Signed)
Transition of Care [facility]) - CM/SW Discharge Note   Patient Details  Name: Paul Schmitt MRN: [id number] Date of Birth: [birth year]  [facility] CM/SW Contact:  Latanya Maudlin, RN Phone Number: 12/08/2018, 10:13 AM   Clinical Narrative:  Patient cleared for discharge back home with hospice services. Patient currently receiving care through [facility]. Notified April at [facility] and faxed updated clinicals. All DME is in place. Patient will use EMS for transport. DNR form on the chart.         Barriers to Discharge: Continued Medical Work up   Patient Goals and CMS Choice        Discharge Placement                       Discharge Plan and Services   Discharge Planning Services: CM Consult                                 Social Determinants of Health (SDOH) Interventions     Readmission Risk Interventions No flowsheet data found.

## 2018-12-08 NOTE — Discharge Summary (Signed)
[facility] [place] NAME: Paul Schmitt    MR#:  [id number]  DATE OF BIRTH:  February 12, 1923  DATE OF ADMISSION:  12/06/2018 ADMITTING PHYSICIAN: Henreitta Leber, MD  DATE OF DISCHARGE: 12/08/2018  PRIMARY CARE PHYSICIAN: Cene', Crystal, MD    ADMISSION DIAGNOSIS:  Seizure ([place]) [R56.9] Malignant neoplasm of lung, unspecified laterality, unspecified part of lung ([place]) [C34.90]  DISCHARGE DIAGNOSIS:   seizures  SECONDARY DIAGNOSIS:   Past Medical History:  Diagnosis Date  . Asthma   . Carotid arterial disease ([place])    a. 01/2015 CT neck: incidental finding of severe RICA stenosis;  b. 01/2015 Carotid U/S: RICA 62-37%, LICA 62-83%.  . Diastolic dysfunction    a. echo 08/2014: EF 60-65%, nl wall motion, GR1DD, mildly dilated LA, nl PASP  . Essential hypertension   . Normal coronary arteries    a. cardiac cath 1989: normal coronary arteries; b. 09/2014 nuclear stress test: no significant ischemia or scar, EF 49%.  . Palpitations    a. 09/2014 Event Monitor: sinus rhythm, rare pvc's, no notable arrhythmia.  . Parotid mass    a. 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  . Prostate cancer ([place])   . RBBB   . Syncope   . Vertigo     HOSPITAL COURSE:  85 y.o.malewith a known history of lung cancer, history of carotid artery disease, hypertension, chronic diastolic CHF who presented to the hospital secondary to seizures.  1. Seizures patient had 3 seizures 12/06/18. He does have a previous history of seizures but currently is not on any antiepileptics. As per the patient's daughter he does not notify them when he does have a seizure. -Patient has been loaded with IV Keppra and will continue Keppra 500 mg twice a day--no seizures reported in house -CT head was negative for acute pathology or metastatic disease. -EEG not done - MRI brian--nothing acute - Neurology - Dr Ranelle Oyster can cont po  keppra and see Neurology as outpt  2. Hypokalemia-will replace potassium orally and repeat level in the morning. -k 3.7 -IV Magnesium 2 g x 1  3. History of chronic diastolic CHF-clinically patient is not in congestive heart failure -Continue Lasix, Norvasc.  4. Essential hypertension-continue Norvasc.  5. History of lung cancer-patient is status post radiation treatment and currently is refusing any further treatment. He is followed by hospice at home.  -Continue Marinol.  6. Hypothyroidism-continue Synthroid.  Spoke with dter--overall best at baseline D/c home with EMS--dter agreeable CONSULTS OBTAINED:  Treatment Team:  Catarina Hartshorn, MD Alexis Goodell, MD  DRUG ALLERGIES:  No Known Allergies  DISCHARGE MEDICATIONS:   Allergies as of 12/08/2018   No Known Allergies     Medication List    TAKE these medications   albuterol 108 (90 Base) MCG/ACT inhaler Commonly known as: VENTOLIN HFA Inhale 2 puffs into the lungs every 6 (six) hours as needed for wheezing or shortness of breath.   albuterol (2.5 MG/3ML) 0.083% nebulizer solution Commonly known as: PROVENTIL Take 2.5 mg by nebulization every 6 (six) hours as needed for wheezing or shortness of breath.   amLODipine 10 MG tablet Commonly known as: NORVASC Take 10 mg by mouth every morning.   aspirin EC 81 MG tablet Take 1 tablet (81 mg total) by mouth daily.   dronabinol 2.5 MG capsule Commonly known as: MARINOL Take 2.5 mg by mouth 2 (two) times daily before a meal.  furosemide 20 MG tablet Commonly known as: LASIX Take 20 mg by mouth every morning.   levETIRAcetam 500 MG tablet Commonly known as: KEPPRA Take 1 tablet (500 mg total) by mouth 2 (two) times daily.   levothyroxine 50 MCG tablet Commonly known as: SYNTHROID Take 50 mcg by mouth daily before breakfast.   loratadine 10 MG tablet Commonly known as: CLARITIN Take 10 mg by mouth every morning.   traZODone 100 MG  tablet Commonly known as: DESYREL Take 100 mg by mouth at bedtime.       If you experience worsening of your admission symptoms, develop shortness of breath, life threatening emergency, suicidal or homicidal thoughts you must seek medical attention immediately by calling 911 or calling your MD immediately  if symptoms less severe.  You Must read complete instructions/literature along with all the possible adverse reactions/side effects for all the Medicines you take and that have been prescribed to you. Take any new Medicines after you have completely understood and accept all the possible adverse reactions/side effects.   Please note  You were cared for by a hospitalist during your hospital stay. If you have any questions about your discharge medications or the care you received while you were in the hospital after you are discharged, you can call the unit and asked to speak with the hospitalist on call if the hospitalist that took care of you is not available. Once you are discharged, your primary care physician will handle any further medical issues. Please note that NO REFILLS for any discharge medications will be authorized once you are discharged, as it is imperative that you return to your primary care physician (or establish a relationship with a primary care physician if you do not have one) for your aftercare needs so that they can reassess your need for medications and monitor your lab values. Today   SUBJECTIVE   Patient is awake alert oriented. Ready to eat some breakfast. No seizures reported by RN  VITAL SIGNS:  Blood pressure 112/83, pulse 99, temperature 97.8 F (36.6 C), temperature source Oral, resp. rate 18, height 6' (1.829 m), weight 63.5 kg, SpO2 100 %.  I/O:    Intake/Output Summary (Last 24 hours) at 12/08/2018 0830 Last data filed at 12/07/2018 1500 Gross per 24 hour  Intake 240 ml  Output -  Net 240 ml    PHYSICAL EXAMINATION:  GENERAL:  85 y.o.-year-old  patient lying in the bed with no acute distress. Thin EYES: Pupils equal, round, reactive to light and accommodation. No scleral icterus. Extraocular muscles intact.  HEENT: Head atraumatic, normocephalic. Oropharynx and nasopharynx clear.  NECK:  Supple, no jugular venous distention. No thyroid enlargement, no tenderness.  LUNGS: Normal breath sounds bilaterally, no wheezing, rales,rhonchi or crepitation. No use of accessory muscles of respiration.  CARDIOVASCULAR: S1, S2 normal. No murmurs, rubs, or gallops.  ABDOMEN: Soft, non-tender, non-distended. Bowel sounds present. No organomegaly or mass.  EXTREMITIES: No pedal edema, cyanosis, or clubbing.  NEUROLOGIC: Cranial nerves II through XII are intact. Muscle strength 5/5 in all extremities. Sensation intact. Gait not checked.  PSYCHIATRIC: The patient is alert and oriented x 3.  SKIN: No obvious rash, lesion, or ulcer.   DATA REVIEW:   CBC  Recent Labs  Lab 12/06/18 1631  WBC 7.2  HGB 10.0*  HCT 31.2*  PLT 403*    Chemistries  Recent Labs  Lab 12/06/18 1631  12/08/18 0624  NA 134*   < > 135  K 3.0*   < >  3.7  CL 98   < > 98  CO2 27   < > 28  GLUCOSE 103*   < > 98  BUN 11   < > 9  CREATININE 0.77   < > 0.77  CALCIUM 8.2*   < > 8.4*  MG  --   --  1.6*  AST 16  --   --   ALT 10  --   --   ALKPHOS 58  --   --   BILITOT 0.6  --   --    < > = values in this interval not displayed.      RADIOLOGY:  Ct Head W Or Wo Contrast  Result Date: 12/06/2018 CLINICAL DATA:  Seizure. History of lung cancer. Rule out metastatic disease EXAM: CT HEAD WITHOUT AND WITH CONTRAST TECHNIQUE: Contiguous axial images were obtained from the base of the skull through the vertex without and with intravenous contrast CONTRAST:  71mL OMNIPAQUE IOHEXOL 300 MG/ML  SOLN COMPARISON:  CT head 08/06/2017 FINDINGS: Brain: Cerebral atrophy, most severe in the frontal and temporal lobes. Negative for hydrocephalus. Symmetric white matter hypodensity  bilaterally is mildly progressive in the frontal lobes. Negative for acute infarct. Negative for hemorrhage mass or edema. No enhancing metastatic deposits. Vascular: Atherosclerotic calcification. Negative for hyperdense vessel Skull: Negative Sinuses/Orbits: Mild mucosal edema paranasal sinuses. Bilateral cataract surgery. Other: None IMPRESSION: Negative for metastatic disease.  No acute abnormality Severe frontal and temporal atrophy. Progression of chronic microvascular ischemic change in the white matter. Electronically Signed   By: Franchot Gallo M.D.   On: 12/06/2018 18:20   Mr Jeri Cos XL Contrast  Result Date: 12/07/2018 CLINICAL DATA:  85 year old male with seizure and history of lung cancer. EXAM: MRI HEAD WITHOUT AND WITH CONTRAST TECHNIQUE: Multiplanar, multiecho pulse sequences of the brain and surrounding structures were obtained without and with intravenous contrast. CONTRAST:  11mL GADAVIST GADOBUTROL 1 MMOL/ML IV SOLN COMPARISON:  Head CT yesterday. FINDINGS: Brain: No abnormal enhancement identified. No midline shift, mass effect, or evidence of intracranial mass lesion. No dural thickening. No restricted diffusion to suggest acute infarction. No ventriculomegaly, extra-axial collection or acute intracranial hemorrhage. Pituitary within normal limits. Mild cortical encephalomalacia in the right frontal lobe on series 15, image 39 and also at the motor strip on image 46. Patchy and confluent bilateral cerebral white matter T2 and FLAIR hyperintensity, most resembling chronic white matter lacunar infarcts in some places. Mild associated hemosiderin. Small chronic infarct in the right cerebellum. Deep gray matter nuclei and brainstem are spared. Hippocampal formations appear symmetric and normal for age. Vascular: Major intracranial vascular flow voids are preserved. The major dural venous sinuses are enhancing and appear to be patent. Skull and upper cervical spine: Bulky ligamentous hypertrophy  about the odontoid resulting in mild stenosis at the cervicomedullary junction which otherwise appears negative. Bulky disc and endplate degeneration elsewhere in the visible cervical spine with multilevel spinal stenosis. Skull bone marrow signal is within normal limits. Marrow heterogeneity in the cervical spine appears to be degenerative in nature. Sinuses/Orbits: Postoperative changes to both globes. Chronic left lamina papyracea fracture. Paranasal sinuses and mastoids are stable and well pneumatized. Other: Scalp soft tissues appear negative, but there is abnormal soft tissue nodularity in the right neck which may be arising from the parotid gland seen on series 21, image 18 and series 22, image 5. This measures about 2 centimeters and was not included on the head CT yesterday. IMPRESSION: 1. No intracranial metastatic disease  or acute intracranial abnormality. 2. Nonspecific 2 cm soft tissue mass in the right upper neck near the parotid gland. Consider metastatic lymphadenopathy versus a primary salivary gland tumor. 3. Chronic ischemic disease in the brain is moderate for age. 4. Severe chronic cervical spine degeneration with multilevel degenerative spinal stenosis. Electronically Signed   By: Genevie Ann M.D.   On: 12/07/2018 20:28     CODE STATUS:     Code Status Orders  (From admission, onward)         Start     Ordered   12/06/18 2220  Do not attempt resuscitation (DNR)  Continuous    Question Answer Comment  In the event of cardiac or respiratory ARREST Do not call a "code blue"   In the event of cardiac or respiratory ARREST Do not perform Intubation, CPR, defibrillation or ACLS   In the event of cardiac or respiratory ARREST Use medication by any route, position, wound care, and other measures to relive pain and suffering. May use oxygen, suction and manual treatment of airway obstruction as needed for comfort.      12/06/18 2219        Code Status History    Date Active Date  Inactive Code Status Order ID Comments User Context   12/03/2018 1818 12/05/2018 1730 DNR [id number]  Asencion Noble, MD ED   Advance Care Planning Activity    Advance Directive Documentation     Most Recent Value  Type of Advance Directive  Healthcare Power of [place], Living will  Pre-existing out of facility DNR order (yellow form or pink MOST form)  -  "MOST" Form in Place?  -      TOTAL TIME TAKING CARE OF THIS PATIENT: *40* minutes.    Fritzi Mandes M.D on 12/08/2018 at 8:30 AM  Between 7am to 6pm - Pager - [phone] After 6pm go to [email or web address] - Proofreader  [facility]  Office  [phone]  CC: Primary care physician; Benson Setting, MD

## 2018-12-08 NOTE — Progress Notes (Signed)
Pt is being discharged home. Discharge papers given and explained to pt. Pt verbalized understanding. Rx and f/u appointments reviewed. Rx to be picked up from pharmacy. Awaiting EMS for transportation.

## 2018-12-12 ENCOUNTER — Ambulatory Visit: Payer: Medicare HMO | Admitting: Internal Medicine

## 2018-12-12 NOTE — Progress Notes (Deleted)
Follow-up Outpatient Visit Date: 12/12/2018  Primary Care Provider: Benson Setting, MD [address] [facility] [id number] [facility] [place] [address]  Chief Complaint: ***  HPI:  [name] is a 85 y.o. year-old male with history of stroke (12/2017) with new diagnosis of atrial fibrillation at that time, RBBB, hypertension, diastolic dysfunction, carotid artery stenosis, and tobacco use, who presents for follow-up of atrial fibrillation, diastolic dysfunction, and syncope.  He was last seen in our office by Marrianne Mood, PA, on 11/02/2018, at which time he complained of right leg swelling with concern for DVT.  His caregiver was also concerned for UTI.  At that time, he was found to be in a-fib with RVR.  We had previously discussed starting warfarin (unable to afford NOAC), but this has been on hold due to workup of anemia by his PCP.  He has been hospitalized twice this month due to falls.  It was felt that dehydration complicated by seizures.  He and his family had wished to pursue hospice care following hospitalization on 12/03/2018.  He presented to [facility] after having 3 seizures.  No acute intracranial abnormality was identified.  --------------------------------------------------------------------------------------------------  Past Medical History:  Diagnosis Date  . Asthma   . Carotid arterial disease ([place])    a. 01/2015 CT neck: incidental finding of severe RICA stenosis;  b. 01/2015 Carotid U/S: RICA 60-63%, LICA 01-60%.  . Diastolic dysfunction    a. echo 08/2014: EF 60-65%, nl wall motion, GR1DD, mildly dilated LA, nl PASP  . Essential hypertension   . Normal coronary arteries    a. cardiac cath 1989: normal coronary arteries; b. 09/2014 nuclear stress test: no significant ischemia or scar, EF 49%.  . Palpitations    a. 09/2014 Event Monitor: sinus rhythm, rare pvc's, no notable arrhythmia.  . Parotid mass    a. 01/2015 CT neck: 15x21x29 mm mass in the tail of the  right parotid gland.  . Prostate cancer ([place])   . RBBB   . Syncope   . Vertigo    Past Surgical History:  Procedure Laterality Date  . HERNIA REPAIR      No outpatient medications have been marked as taking for the 12/12/18 encounter (Appointment) with Josephene Marrone, Harrell Gave, MD.    Allergies: Patient has no known allergies.  Social History   Tobacco Use  . Smoking status: Former Smoker    Packs/day: 0.25    Years: 70.00    Pack years: 17.50    Types: Cigarettes    Quit date: 05/29/2014    Years since quitting: 4.5  . Smokeless tobacco: Never Used  . Tobacco comment: stop smoking about 36mths ago  Substance Use Topics  . Alcohol use: No  . Drug use: No    Family History  Problem Relation Age of Onset  . Heart disease Mother   . Heart attack Mother 49  . Hyperlipidemia Mother   . Hypertension Mother   . Heart disease Sister   . Heart attack Sister 68  . Heart disease Brother   . Heart attack Brother 56    Review of Systems: A 12-system review of systems was performed and was negative except as noted in the HPI.  --------------------------------------------------------------------------------------------------  Physical Exam: There were no vitals taken for this visit.  General:  *** HEENT: No conjunctival pallor or scleral icterus. Moist mucous membranes.  OP clear. Neck: Supple without lymphadenopathy, thyromegaly, JVD, or HJR. No carotid bruit. Lungs: Normal work of breathing. Clear to auscultation bilaterally  without wheezes or crackles. Heart: Regular rate and rhythm without murmurs, rubs, or gallops. Non-displaced PMI. Abd: Bowel sounds present. Soft, NT/ND without hepatosplenomegaly Ext: No lower extremity edema. Radial, PT, and DP pulses are 2+ bilaterally. Skin: Warm and dry without rash.  EKG:  ***  Lab Results  Component Value Date   WBC 7.2 12/06/2018   HGB 10.0 (L) 12/06/2018   HCT 31.2 (L) 12/06/2018   MCV 78.4 (L) 12/06/2018   PLT 403 (H)  12/06/2018    Lab Results  Component Value Date   NA 135 12/08/2018   K 3.7 12/08/2018   CL 98 12/08/2018   CO2 28 12/08/2018   BUN 9 12/08/2018   CREATININE 0.77 12/08/2018   GLUCOSE 98 12/08/2018   ALT 10 12/06/2018    Lab Results  Component Value Date   CHOL 189 03/26/2015   HDL 60 03/26/2015   LDLCALC 118 (H) 03/26/2015   TRIG 55 03/26/2015   CHOLHDL 3.2 03/26/2015    --------------------------------------------------------------------------------------------------  ASSESSMENT AND PLAN: Harrell Gave Abem Shaddix, MD 12/12/2018 8:02 AM

## 2018-12-26 ENCOUNTER — Inpatient Hospital Stay
Admission: EM | Admit: 2018-12-26 | Discharge: 2018-12-27 | DRG: 181 | Disposition: A | Discharge status: Hospice | Attending: Internal Medicine | Admitting: Internal Medicine

## 2018-12-26 ENCOUNTER — Emergency Department

## 2018-12-26 ENCOUNTER — Other Ambulatory Visit: Payer: Self-pay

## 2018-12-26 ENCOUNTER — Encounter: Payer: Self-pay | Admitting: Emergency Medicine

## 2018-12-26 DIAGNOSIS — Z7982 Long term (current) use of aspirin: Secondary | ICD-10-CM | POA: Diagnosis not present

## 2018-12-26 DIAGNOSIS — Z66 Do not resuscitate: Secondary | ICD-10-CM | POA: Diagnosis present

## 2018-12-26 DIAGNOSIS — R569 Unspecified convulsions: Secondary | ICD-10-CM | POA: Diagnosis present

## 2018-12-26 DIAGNOSIS — G4700 Insomnia, unspecified: Secondary | ICD-10-CM | POA: Diagnosis present

## 2018-12-26 DIAGNOSIS — D630 Anemia in neoplastic disease: Secondary | ICD-10-CM | POA: Diagnosis present

## 2018-12-26 DIAGNOSIS — Z79890 Hormone replacement therapy (postmenopausal): Secondary | ICD-10-CM

## 2018-12-26 DIAGNOSIS — Z8249 Family history of ischemic heart disease and other diseases of the circulatory system: Secondary | ICD-10-CM | POA: Diagnosis not present

## 2018-12-26 DIAGNOSIS — Z79899 Other long term (current) drug therapy: Secondary | ICD-10-CM | POA: Diagnosis not present

## 2018-12-26 DIAGNOSIS — C3492 Malignant neoplasm of unspecified part of left bronchus or lung: Principal | ICD-10-CM | POA: Diagnosis present

## 2018-12-26 DIAGNOSIS — I4510 Unspecified right bundle-branch block: Secondary | ICD-10-CM | POA: Diagnosis present

## 2018-12-26 DIAGNOSIS — Z8546 Personal history of malignant neoplasm of prostate: Secondary | ICD-10-CM

## 2018-12-26 DIAGNOSIS — Z681 Body mass index (BMI) 19 or less, adult: Secondary | ICD-10-CM | POA: Diagnosis not present

## 2018-12-26 DIAGNOSIS — Z87891 Personal history of nicotine dependence: Secondary | ICD-10-CM

## 2018-12-26 DIAGNOSIS — Z8349 Family history of other endocrine, nutritional and metabolic diseases: Secondary | ICD-10-CM

## 2018-12-26 DIAGNOSIS — Z20828 Contact with and (suspected) exposure to other viral communicable diseases: Secondary | ICD-10-CM | POA: Diagnosis present

## 2018-12-26 DIAGNOSIS — J45909 Unspecified asthma, uncomplicated: Secondary | ICD-10-CM | POA: Diagnosis present

## 2018-12-26 DIAGNOSIS — W1811XA Fall from or off toilet without subsequent striking against object, initial encounter: Secondary | ICD-10-CM | POA: Diagnosis present

## 2018-12-26 DIAGNOSIS — D649 Anemia, unspecified: Secondary | ICD-10-CM | POA: Diagnosis present

## 2018-12-26 DIAGNOSIS — I10 Essential (primary) hypertension: Secondary | ICD-10-CM | POA: Diagnosis present

## 2018-12-26 DIAGNOSIS — E039 Hypothyroidism, unspecified: Secondary | ICD-10-CM | POA: Diagnosis present

## 2018-12-26 DIAGNOSIS — R627 Adult failure to thrive: Secondary | ICD-10-CM | POA: Diagnosis present

## 2018-12-26 DIAGNOSIS — Z515 Encounter for palliative care: Secondary | ICD-10-CM | POA: Diagnosis present

## 2018-12-26 HISTORY — DX: Unspecified convulsions: R56.9

## 2018-12-26 LAB — URINALYSIS, COMPLETE (UACMP) WITH MICROSCOPIC
Bilirubin Urine: NEGATIVE
Glucose, UA: NEGATIVE mg/dL
Hgb urine dipstick: NEGATIVE
Ketones, ur: NEGATIVE mg/dL
Nitrite: NEGATIVE
Protein, ur: NEGATIVE mg/dL
Specific Gravity, Urine: 1.019 (ref 1.005–1.030)
pH: 6 (ref 5.0–8.0)

## 2018-12-26 LAB — CBC WITH DIFFERENTIAL/PLATELET
Abs Immature Granulocytes: 0.02 10*3/uL (ref 0.00–0.07)
Basophils Absolute: 0 10*3/uL (ref 0.0–0.1)
Basophils Relative: 0 %
Eosinophils Absolute: 0.2 10*3/uL (ref 0.0–0.5)
Eosinophils Relative: 5 %
HCT: 15.9 % — ABNORMAL LOW (ref 39.0–52.0)
Hemoglobin: 4.7 g/dL — CL (ref 13.0–17.0)
Immature Granulocytes: 1 %
Lymphocytes Relative: 15 %
Lymphs Abs: 0.6 10*3/uL — ABNORMAL LOW (ref 0.7–4.0)
MCH: 24.7 pg — ABNORMAL LOW (ref 26.0–34.0)
MCHC: 29.6 g/dL — ABNORMAL LOW (ref 30.0–36.0)
MCV: 83.7 fL (ref 80.0–100.0)
Monocytes Absolute: 0.3 10*3/uL (ref 0.1–1.0)
Monocytes Relative: 8 %
Neutro Abs: 2.6 10*3/uL (ref 1.7–7.7)
Neutrophils Relative %: 71 %
Platelets: 203 10*3/uL (ref 150–400)
RBC: 1.9 MIL/uL — ABNORMAL LOW (ref 4.22–5.81)
RDW: 17.3 % — ABNORMAL HIGH (ref 11.5–15.5)
WBC: 3.7 10*3/uL — ABNORMAL LOW (ref 4.0–10.5)
nRBC: 0 % (ref 0.0–0.2)

## 2018-12-26 LAB — LACTIC ACID, PLASMA
Lactic Acid, Venous: 2.6 mmol/L (ref 0.5–1.9)
Lactic Acid, Venous: 2.4 mmol/L (ref 0.5–1.9)

## 2018-12-26 LAB — TROPONIN I (HIGH SENSITIVITY)
Troponin I (High Sensitivity): 78 ng/L — ABNORMAL HIGH (ref <18)
Troponin I (High Sensitivity): 66 ng/L — ABNORMAL HIGH (ref <18)

## 2018-12-26 LAB — COMPREHENSIVE METABOLIC PANEL
ALT: 11 U/L (ref 0–44)
AST: 25 U/L (ref 15–41)
Albumin: 2.1 g/dL — ABNORMAL LOW (ref 3.5–5.0)
Alkaline Phosphatase: 63 U/L (ref 38–126)
Anion gap: 11 (ref 5–15)
BUN: 12 mg/dL (ref 8–23)
CO2: 24 mmol/L (ref 22–32)
Calcium: 8.5 mg/dL — ABNORMAL LOW (ref 8.9–10.3)
Chloride: 101 mmol/L (ref 98–111)
Creatinine, Ser: 0.82 mg/dL (ref 0.61–1.24)
GFR calc Af Amer: >60 mL/min (ref >60)
GFR calc non Af Amer: >60 mL/min (ref >60)
Glucose, Bld: 107 mg/dL — ABNORMAL HIGH (ref 70–99)
Potassium: 3.7 mmol/L (ref 3.5–5.1)
Sodium: 136 mmol/L (ref 135–145)
Total Bilirubin: 0.7 mg/dL (ref 0.3–1.2)
Total Protein: 7.1 g/dL (ref 6.5–8.1)

## 2018-12-26 LAB — PREPARE RBC (CROSSMATCH)

## 2018-12-26 LAB — ABO/RH: ABO/RH(D): A NEG

## 2018-12-26 MED ORDER — ALBUTEROL SULFATE (2.5 MG/3ML) 0.083% IN NEBU: 2.5000 mg | INHALATION_SOLUTION | Freq: Four times a day (QID) | RESPIRATORY_TRACT | Status: DC | PRN

## 2018-12-26 MED ORDER — DRONABINOL 2.5 MG PO CAPS
2.5000 mg | ORAL_CAPSULE | Freq: Two times a day (BID) | ORAL | Status: DC
  Administered 2018-12-27 (×2): 2.5 mg via ORAL
  Filled 2018-12-26 (×2): qty 1

## 2018-12-26 MED ORDER — TRAZODONE HCL 100 MG PO TABS
100.0000 mg | ORAL_TABLET | Freq: Every day | ORAL | Status: DC
  Administered 2018-12-26: 100 mg via ORAL
  Filled 2018-12-26: qty 1

## 2018-12-26 MED ORDER — ONDANSETRON HCL 4 MG/2ML IJ SOLN: 4.0000 mg | Freq: Four times a day (QID) | INTRAMUSCULAR | Status: DC | PRN

## 2018-12-26 MED ORDER — LEVOTHYROXINE SODIUM 50 MCG PO TABS
50.0000 ug | ORAL_TABLET | Freq: Every day | ORAL | Status: DC
  Administered 2018-12-27: 05:00:00 50 ug via ORAL
  Filled 2018-12-26: qty 1

## 2018-12-26 MED ORDER — LEVETIRACETAM 500 MG PO TABS
500.0000 mg | ORAL_TABLET | Freq: Two times a day (BID) | ORAL | Status: DC
  Administered 2018-12-26 – 2018-12-27 (×2): 500 mg via ORAL
  Filled 2018-12-26 (×2): qty 1

## 2018-12-26 MED ORDER — SODIUM CHLORIDE 0.9 % IV SOLN
10.0000 mL/h | Freq: Once | INTRAVENOUS | Status: AC
  Administered 2018-12-26: 10 mL/h via INTRAVENOUS

## 2018-12-26 MED ORDER — SODIUM CHLORIDE 0.9% IV SOLUTION
Freq: Once | INTRAVENOUS | Status: AC
  Administered 2018-12-26: 19:00:00 via INTRAVENOUS
  Filled 2018-12-26: qty 250

## 2018-12-26 MED ORDER — AMLODIPINE BESYLATE 10 MG PO TABS
10.0000 mg | ORAL_TABLET | Freq: Every morning | ORAL | Status: DC
  Administered 2018-12-27: 10 mg via ORAL
  Filled 2018-12-26: qty 1

## 2018-12-26 MED ORDER — ACETAMINOPHEN 325 MG PO TABS: 650.0000 mg | ORAL_TABLET | Freq: Four times a day (QID) | ORAL | Status: DC | PRN

## 2018-12-26 MED ORDER — LORATADINE 10 MG PO TABS
10.0000 mg | ORAL_TABLET | Freq: Every morning | ORAL | Status: DC
  Administered 2018-12-27: 08:00:00 10 mg via ORAL
  Filled 2018-12-26: qty 1

## 2018-12-26 MED ORDER — SODIUM CHLORIDE 0.9 % IV BOLUS
1000.0000 mL | Freq: Once | INTRAVENOUS | Status: AC
  Administered 2018-12-26: 17:00:00 1000 mL via INTRAVENOUS

## 2018-12-26 MED ORDER — ALBUTEROL SULFATE HFA 108 (90 BASE) MCG/ACT IN AERS: 2.0000 | INHALATION_SPRAY | Freq: Four times a day (QID) | RESPIRATORY_TRACT | Status: DC | PRN

## 2018-12-26 MED ORDER — ACETAMINOPHEN 650 MG RE SUPP: 650.0000 mg | Freq: Four times a day (QID) | RECTAL | Status: DC | PRN

## 2018-12-26 MED ORDER — FUROSEMIDE 20 MG PO TABS
20.0000 mg | ORAL_TABLET | Freq: Every morning | ORAL | Status: DC
  Administered 2018-12-27: 08:00:00 20 mg via ORAL
  Filled 2018-12-26: qty 1

## 2018-12-26 MED ORDER — ONDANSETRON HCL 4 MG PO TABS: 4.0000 mg | ORAL_TABLET | Freq: Four times a day (QID) | ORAL | Status: DC | PRN

## 2018-12-26 NOTE — Progress Notes (Signed)
ED visit made. Patient is currently followed by [facility] hospice at home with a hospice diagnosis of Lung cancer. He is a DNR code with out of facility DNR in place in the home. He was sent to the [facility] ED via EMS by his daughter who reported he had had a seizure. Blood work in the ED revealed a hemoglobin of 4. Patient seen lying on the ED stretcher, alert and able to answer questions, reported being cold, warm blankets x 4 given. Staff RN Mateo Flow notified that patient's HCPOA was his sister in law Dora, both staff RN and EDP Dr. Cinda Quest spoke with [place]. Patient's daughter Wein[place] arrived. Emotional support given. Patient would like to receive blood, family in agreement to give blood, but to forego any invasive diagnostic procedure. Plan is for admission to receive blood transfusion with plan to return home tomorrow to continue hospice services. Patient's home medication list and out of facility DNR present in the room. Will continue to follow and update hospice team. Flo Shanks BSN, RN, [place] [phone]

## 2018-12-26 NOTE — ED Notes (Signed)
Blood consent obtained via phone with [place]. 2 RN verified.

## 2018-12-26 NOTE — ED Triage Notes (Signed)
Pt here for seizure today. Pt currently answering questions and following commands. Drowsy. Hx seizure. DNR present on arrival.

## 2018-12-26 NOTE — ED Notes (Signed)
ED TO INPATIENT HANDOFF REPORT  ED Nurse Name and Phone #: Daiva Nakayama, RN [phone]  S Name/Age/Gender Paul Schmitt 85 y.o. male Room/Bed: ED15A/ED15A  Code Status   Code Status: Prior  Home/SNF/Other Home Patient oriented to: self, place and situation Is this baseline? Yes   Triage Complete: Triage complete  Chief Complaint seizure  Triage Note Pt here for seizure today. Pt currently answering questions and following commands. Drowsy. Hx seizure. DNR present on arrival.    Allergies No Known Allergies  Level of Care/Admitting Diagnosis ED Disposition    ED Disposition Condition [facility] Area: [place] [[id number]]  Level of Care: Med-Surg [16]  Covid Evaluation: Person Under Investigation (PUI)  Diagnosis: Symptomatic anemia [[id number]]  Admitting Physician: Nicholes Mango [[id number]]  Attending Physician: Nicholes Mango [[id number]]  Estimated length of stay: past midnight tomorrow  Certification:: I certify this patient will need inpatient services for at least 2 midnights  Bed request comments: 1c  PT Class (Do Not Modify): Inpatient [101]  PT Acc Code (Do Not Modify): Private [1]       B Medical/Surgery History Past Medical History:  Diagnosis Date  . Asthma   . Carotid arterial disease ([place])    a. 01/2015 CT neck: incidental finding of severe RICA stenosis;  b. 01/2015 Carotid U/S: RICA 64-68%, LICA 03-21%.  . Diastolic dysfunction    a. echo 08/2014: EF 60-65%, nl wall motion, GR1DD, mildly dilated LA, nl PASP  . Essential hypertension   . Normal coronary arteries    a. cardiac cath 1989: normal coronary arteries; b. 09/2014 nuclear stress test: no significant ischemia or scar, EF 49%.  . Palpitations    a. 09/2014 Event Monitor: sinus rhythm, rare pvc's, no notable arrhythmia.  . Parotid mass    a. 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  . Prostate cancer ([place])   . RBBB   . Seizures ([place])   .  Syncope   . Vertigo    Past Surgical History:  Procedure Laterality Date  . HERNIA REPAIR       A IV Location/Drains/Wounds Patient Lines/Drains/Airways Status   Active Line/Drains/Airways    Name:   Placement date:   Placement time:   Site:   Days:   Peripheral IV 12/26/18 Anterior;Distal;Left;Upper Arm   12/26/18    -    Arm   less than 1   Pressure Injury 12/06/18 Buttocks Right Stage II -  Partial thickness loss of dermis presenting as a shallow open ulcer with a red, pink wound bed without slough.   12/06/18    2338     20   Wound / Incision (Open or Dehisced) 12/06/18 Non-pressure wound Penis Anterior   12/06/18    2338    Penis   20          Intake/Output Last 24 hours  Intake/Output Summary (Last 24 hours) at 12/26/2018 1928 Last data filed at 12/26/2018 1846 Gross per 24 hour  Intake 1000 ml  Output -  Net 1000 ml    Labs/Imaging Results for orders placed or performed during the hospital encounter of 12/26/18 (from the past 48 hour(s))  Comprehensive metabolic panel     Status: Abnormal   Collection Time: 12/26/18  4:49 PM  Result Value Ref Range   Sodium 136 135 - 145 mmol/L   Potassium 3.7 3.5 - 5.1 mmol/L   Chloride 101 98 - 111 mmol/L  CO2 24 22 - 32 mmol/L   Glucose, Bld 107 (H) 70 - 99 mg/dL   BUN 12 8 - 23 mg/dL   Creatinine, Ser 0.82 0.61 - 1.24 mg/dL   Calcium 8.5 (L) 8.9 - 10.3 mg/dL   Total Protein 7.1 6.5 - 8.1 g/dL   Albumin 2.1 (L) 3.5 - 5.0 g/dL   AST 25 15 - 41 U/L   ALT 11 0 - 44 U/L   Alkaline Phosphatase 63 38 - 126 U/L   Total Bilirubin 0.7 0.3 - 1.2 mg/dL   GFR calc non Af Amer >60 >60 mL/min   GFR calc Af Amer >60 >60 mL/min   Anion gap 11 5 - 15    Comment: Performed at [facility], [place], [place] [address]  Troponin I (High Sensitivity)     Status: Abnormal   Collection Time: 12/26/18  4:49 PM  Result Value Ref Range   Troponin I (High Sensitivity) 78 (H) <18 ng/L    Comment: (NOTE) Elevated high  sensitivity troponin I (hsTnI) values and significant  changes across serial measurements may suggest ACS but many other  chronic and acute conditions are known to elevate hsTnI results.  Refer to the "Links" section for chest pain algorithms and additional  guidance. Performed at [facility], [address] [place], [place] [address]   CBC with Differential     Status: Abnormal   Collection Time: 12/26/18  4:49 PM  Result Value Ref Range   WBC 3.7 (L) 4.0 - 10.5 K/uL   RBC 1.90 (L) 4.22 - 5.81 MIL/uL   Hemoglobin 4.7 (LL) 13.0 - 17.0 g/dL    Comment: This critical result has verified and been called to [place] by Woodfin Ganja on 09 30 2020 at 1709, and has been read back.    HCT 15.9 (L) 39.0 - 52.0 %   MCV 83.7 80.0 - 100.0 fL   MCH 24.7 (L) 26.0 - 34.0 pg   MCHC 29.6 (L) 30.0 - 36.0 g/dL   RDW 17.3 (H) 11.5 - 15.5 %   Platelets 203 150 - 400 K/uL   nRBC 0.0 0.0 - 0.2 %   Neutrophils Relative % 71 %   Neutro Abs 2.6 1.7 - 7.7 K/uL   Lymphocytes Relative 15 %   Lymphs Abs 0.6 (L) 0.7 - 4.0 K/uL   Monocytes Relative 8 %   Monocytes Absolute 0.3 0.1 - 1.0 K/uL   Eosinophils Relative 5 %   Eosinophils Absolute 0.2 0.0 - 0.5 K/uL   Basophils Relative 0 %   Basophils Absolute 0.0 0.0 - 0.1 K/uL   Immature Granulocytes 1 %   Abs Immature Granulocytes 0.02 0.00 - 0.07 K/uL    Comment: Performed at [facility], [address], [place], [place] [address]  ABO/Rh     Status: None   Collection Time: 12/26/18  4:49 PM  Result Value Ref Range   ABO/RH(D)      A NEG Performed at [facility], [address] [place], [place] [address]   Lactic acid, plasma     Status: Abnormal   Collection Time: 12/26/18  4:51 PM  Result Value Ref Range   Lactic Acid, Venous 2.6 (HH) 0.5 - 1.9 mmol/L    Comment: CRITICAL RESULT CALLED TO, READ BACK BY AND VERIFIED WITH MAC BROWN 12/26/18 @ 1725  [place] Performed at [facility], [address]  [place], [place] [address]   Urinalysis, Complete  w Microscopic     Status: Abnormal   Collection Time: 12/26/18  5:06 PM  Result Value Ref Range   Color, Urine YELLOW (A) YELLOW   APPearance CLOUDY (A) CLEAR   Specific Gravity, Urine 1.019 1.005 - 1.030   pH 6.0 5.0 - 8.0   Glucose, UA NEGATIVE NEGATIVE mg/dL   Hgb urine dipstick NEGATIVE NEGATIVE   Bilirubin Urine NEGATIVE NEGATIVE   Ketones, ur NEGATIVE NEGATIVE mg/dL   Protein, ur NEGATIVE NEGATIVE mg/dL   Nitrite NEGATIVE NEGATIVE   Leukocytes,Ua TRACE (A) NEGATIVE   RBC / HPF 0-5 0 - 5 RBC/hpf   WBC, UA 11-20 0 - 5 WBC/hpf   Bacteria, UA RARE (A) NONE SEEN   Squamous Epithelial / LPF 0-5 0 - 5   Mucus PRESENT    Non Squamous Epithelial PRESENT (A) NONE SEEN    Comment: Performed at [facility], [address] [place], [place] [address]  Type and screen [place]     Status: None (Preliminary result)   Collection Time: 12/26/18  5:23 PM  Result Value Ref Range   ABO/RH(D) A NEG    Antibody Screen NEG    Sample Expiration 12/29/2018,2359    Unit Number [id number]    Blood Component Type RED CELLS,LR    Unit division 00    Status of Unit ISSUED    Transfusion Status OK TO TRANSFUSE    Crossmatch Result      Compatible Performed at [facility], [address], [place], [place] [address]    Unit Number [id number]    Blood Component Type RED CELLS,LR    Unit division 00    Status of Unit ALLOCATED    Transfusion Status OK TO TRANSFUSE    Crossmatch Result Compatible   Prepare RBC     Status: None   Collection Time: 12/26/18  5:31 PM  Result Value Ref Range   Order Confirmation      ORDER PROCESSED BY BLOOD BANK Performed at [facility], [address] [place], [place] [address]   Lactic acid, plasma     Status: Abnormal   Collection Time: 12/26/18  6:37 PM  Result Value Ref Range   Lactic Acid, Venous 2.4 (HH) 0.5 - 1.9 mmol/L    Comment: CRITICAL VALUE  NOTED. VALUE IS CONSISTENT WITH PREVIOUSLY REPORTED/CALLED VALUE / [place] Performed at [facility], [place], [place] [address]   Troponin I (High Sensitivity)     Status: Abnormal   Collection Time: 12/26/18  6:37 PM  Result Value Ref Range   Troponin I (High Sensitivity) 66 (H) <18 ng/L    Comment: (NOTE) Elevated high sensitivity troponin I (hsTnI) values and significant  changes across serial measurements may suggest ACS but many other  chronic and acute conditions are known to elevate hsTnI results.  Refer to the "Links" section for chest pain algorithms and additional  guidance. Performed at [facility], [address] [place], [place] [address]    Dg Chest Portable 1 View  Result Date: 12/26/2018 CLINICAL DATA:  Weakness EXAM: PORTABLE CHEST 1 VIEW COMPARISON:  12/03/2018 chest radiograph. FINDINGS: Stable complete opacification of the left hemithorax. Trachea slightly left of midline. Stable normal right mediastinal contour. Unable to accurately assess heart size. No right pneumothorax. No right pleural effusion. Clear right lung. IMPRESSION: Complete opacification of the left hemithorax, unchanged since 12/03/2018 chest radiograph, suspected to be due to obstructing central left lung mass on  12/03/2018 chest CT angiogram study. Clear right lung. Electronically Signed   By: Ilona Sorrel M.D.   On: 12/26/2018 17:12    Pending Labs Unresulted Labs (From admission, onward)    Start     Ordered   12/26/18 1848  SARS CORONAVIRUS 2 (TAT 6-24 HRS) Nasopharyngeal Nasopharyngeal Swab  (Asymptomatic/Tier 2)  Once,   STAT    Question Answer Comment  Is this test for diagnosis or screening Screening   Symptomatic for COVID-19 as defined by CDC No   Hospitalized for COVID-19 No   Admitted to ICU for COVID-19 No   Previously tested for COVID-19 No   Resident in a congregate (group) care setting No   Employed in healthcare setting No      12/26/18 1847           Vitals/Pain Today's Vitals   12/26/18 1730 12/26/18 1800 12/26/18 1830 12/26/18 1904  BP: 109/85 100/76 112/79 104/85  Pulse: (!) 106   (!) 104  Resp: (!) 27 (!) _0 Temp:    97.6 F (36.4 C)  TempSrc:    Oral  SpO2: 95% 94% 95% 95%  Weight:      Height:      PainSc:        Isolation Precautions No active isolations  Medications Medications  0.9 %  sodium chloride infusion (has no administration in time range)  sodium chloride 0.9 % bolus 1,000 mL (0 mLs Intravenous Stopped 12/26/18 1846)  0.9 %  sodium chloride infusion (Manually program via Guardrails IV Fluids) ( Intravenous New Bag/Given 12/26/18 1918)    Mobility non-ambulatory Low fall risk   Focused Assessments  R Recommendations: See Admitting Provider Note  Report given to:   Additional Notes: Hospice pt with yellow DNR in chart

## 2018-12-26 NOTE — ED Provider Notes (Addendum)
[facility] Emergency Department Provider Note   ____________________________________________   First MD Initiated Contact with Patient 12/26/18 1648     (approximate)  I have reviewed the triage vital signs and the nursing notes.   HISTORY  Chief Complaint Seizures    HPI Paul Schmitt is a 85 y.o. male with lung cancer and white out of the left lung on previous chest x-ray who has a known history of seizures.  Was sitting on the toilet and had a seizure apparently fell off.  He is a no code comfort care patient.  He however was very weak and tachycardic so we did some blood work and found out that he is severely anemic.  He has no history of blood in the toilet today.  After discussing with hospice family does not want any further evaluation including rectal exam at this point.  We will give him some blood to try and get him to be a little more comfortable.  We will also give him some fluids.  His elevated lactic acid is probably due to his seizure.        Past Medical History:  Diagnosis Date  . Asthma   . Carotid arterial disease ([place])    a. 01/2015 CT neck: incidental finding of severe RICA stenosis;  b. 01/2015 Carotid U/S: RICA 20-35%, LICA 59-74%.  . Diastolic dysfunction    a. echo 08/2014: EF 60-65%, nl wall motion, GR1DD, mildly dilated LA, nl PASP  . Essential hypertension   . Normal coronary arteries    a. cardiac cath 1989: normal coronary arteries; b. 09/2014 nuclear stress test: no significant ischemia or scar, EF 49%.  . Palpitations    a. 09/2014 Event Monitor: sinus rhythm, rare pvc's, no notable arrhythmia.  . Parotid mass    a. 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  . Prostate cancer ([place])   . RBBB   . Seizures ([place])   . Syncope   . Vertigo     Patient Active Problem List   Diagnosis Date Noted  . Seizures ([place]) 12/06/2018  . Malignant neoplasm of lung ([place])   . Goals of care,  counseling/discussion   . Palliative care by specialist   . Pleural effusion on left   . AMS (altered mental status) 12/03/2018  . Venous stasis 11/02/2018  . Atrial fibrillation ([place]) 07/24/2018  . History of stroke 07/24/2018  . Dyspnea on exertion 07/24/2018  . Palpitations   . Carotid arterial disease ([place])   . Uncontrolled hypertension   . Vertigo   . Diastolic dysfunction   . Syncope   . Intermittent palpitations 08/26/2014  . Chest tightness 08/26/2014  . Fatigue - with exercise intolerance 08/26/2014  . Bradycardia 08/26/2014    Past Surgical History:  Procedure Laterality Date  . HERNIA REPAIR      Prior to Admission medications   Medication Sig Start Date End Date Taking? Authorizing Provider  albuterol (PROVENTIL HFA;VENTOLIN HFA) 108 (90 Base) MCG/ACT inhaler Inhale 2 puffs into the lungs every 6 (six) hours as needed for wheezing or shortness of breath.    [provider]  albuterol (PROVENTIL) (2.5 MG/3ML) 0.083% nebulizer solution Take 2.5 mg by nebulization every 6 (six) hours as needed for wheezing or shortness of breath.  08/15/18   [provider]  amLODipine (NORVASC) 10 MG tablet Take 10 mg by mouth every morning.     [provider]  aspirin EC 81  MG tablet Take 1 tablet (81 mg total) by mouth daily. 03/26/15   Theora Gianotti, NP  dronabinol (MARINOL) 2.5 MG capsule Take 2.5 mg by mouth 2 (two) times daily before a meal.    [provider]  furosemide (LASIX) 20 MG tablet Take 20 mg by mouth every morning.     [provider]  levETIRAcetam (KEPPRA) 500 MG tablet Take 1 tablet (500 mg total) by mouth 2 (two) times daily. 12/08/18   Fritzi Mandes, MD  levothyroxine (SYNTHROID, LEVOTHROID) 50 MCG tablet Take 50 mcg by mouth daily before breakfast.    [provider]  loratadine (CLARITIN) 10 MG tablet Take 10 mg by mouth every morning.     [provider]  traZODone (DESYREL) 100 MG tablet  Take 100 mg by mouth at bedtime.  04/05/18   [provider]    Allergies Patient has no known allergies.  Family History  Problem Relation Age of Onset  . Heart disease Mother   . Heart attack Mother 7  . Hyperlipidemia Mother   . Hypertension Mother   . Heart disease Sister   . Heart attack Sister 27  . Heart disease Brother   . Heart attack Brother 73    Social History Social History   Tobacco Use  . Smoking status: Former Smoker    Packs/day: 0.25    Years: 70.00    Pack years: 17.50    Types: Cigarettes    Quit date: 05/29/2014    Years since quitting: 4.5  . Smokeless tobacco: Never Used  . Tobacco comment: stop smoking about 62mths ago  Substance Use Topics  . Alcohol use: No  . Drug use: No    Review of Systems  Constitutional: No fever/chills Eyes: No visual changes. ENT: No sore throat. Cardiovascular: Denies chest pain. Respiratory: Denies shortness of breath. Gastrointestinal: No abdominal pain.  No nausea, no vomiting.  No diarrhea.  No constipation. Genitourinary: Negative for dysuria. Musculoskeletal: Negative for back pain. Skin: Negative for rash. Neurological: Negative for headaches, focal weakness   ____________________________________________   PHYSICAL EXAM:  VITAL SIGNS: ED Triage Vitals  Enc Vitals Group     BP 12/26/18 1648 (!) 81/60     Pulse Rate 12/26/18 1650 (!) 125     Resp 12/26/18 1648 (!) 24     Temp 12/26/18 1650 (!) 96.8 F (36 C)     Temp Source 12/26/18 1650 Axillary     SpO2 12/26/18 1650 95 %     Weight 12/26/18 1648 140 lb (63.5 kg)     Height 12/26/18 1648 6' (1.829 m)     Head Circumference --      Peak Flow --      Pain Score 12/26/18 1648 0     Pain Loc --      Pain Edu? --      Excl. in [place]? --     Constitutional: Alert  Well appearing and in no acute distress. Eyes: Conjunctivae are normal.  Head: Atraumatic. Nose: No congestion/rhinnorhea. Mouth/Throat: Mucous membranes are moist.   Oropharynx non-erythematous. Neck: No stridor.  Cardiovascular: Normal rate, regular rhythm. Grossly normal heart sounds.  Good peripheral circulation. Respiratory: Normal respiratory effort.  No retractions. Lungs CTAB. Gastrointestinal: Soft and nontender. No distention. No abdominal bruits. No CVA tenderness. Musculoskeletal: No lower extremity tenderness nor edema.   Neurologic:  Normal speech and language. No gross focal neurologic deficits are appreciated.  He is diffusely very weak though. Skin:  Skin is warm, dry and intact. No rash noted.   ____________________________________________   LABS (all labs ordered are listed, but only abnormal results are displayed)  Labs Reviewed  COMPREHENSIVE METABOLIC PANEL - Abnormal; Notable for the following components:      Result Value   Glucose, Bld 107 (*)    Calcium 8.5 (*)    Albumin 2.1 (*)    All other components within normal limits  LACTIC ACID, PLASMA - Abnormal; Notable for the following components:   Lactic Acid, Venous 2.6 (*)    All other components within normal limits  CBC WITH DIFFERENTIAL/PLATELET - Abnormal; Notable for the following components:   WBC 3.7 (*)    RBC 1.90 (*)    Hemoglobin 4.7 (*)    HCT 15.9 (*)    MCH 24.7 (*)    MCHC 29.6 (*)    RDW 17.3 (*)    Lymphs Abs 0.6 (*)    All other components within normal limits  URINALYSIS, COMPLETE (UACMP) WITH MICROSCOPIC - Abnormal; Notable for the following components:   Color, Urine YELLOW (*)    APPearance CLOUDY (*)    Leukocytes,Ua TRACE (*)    Bacteria, UA RARE (*)    Non Squamous Epithelial PRESENT (*)    All other components within normal limits  TROPONIN I (HIGH SENSITIVITY) - Abnormal; Notable for the following components:   Troponin I (High Sensitivity) 78 (*)    All other components within normal limits  LACTIC ACID, PLASMA  TYPE AND SCREEN  PREPARE RBC (CROSSMATCH)  ABO/RH   ____________________________________________  EKG    ____________________________________________  RADIOLOGY  ED MD interpretation: Chest x-ray shows white out of the left side which is old  Official radiology report(s): Dg Chest Portable 1 View  Result Date: 12/26/2018 CLINICAL DATA:  Weakness EXAM: PORTABLE CHEST 1 VIEW COMPARISON:  12/03/2018 chest radiograph. FINDINGS: Stable complete opacification of the left hemithorax. Trachea slightly left of midline. Stable normal right mediastinal contour. Unable to accurately assess heart size. No right pneumothorax. No right pleural effusion. Clear right lung. IMPRESSION: Complete opacification of the left hemithorax, unchanged since 12/03/2018 chest radiograph, suspected to be due to obstructing central left lung mass on 12/03/2018 chest CT angiogram study. Clear right lung. Electronically Signed   By: Ilona Sorrel M.D.   On: 12/26/2018 17:12    ____________________________________________   PROCEDURES  Procedure(s) performed (including Critical Care): Critical care time 25 minutes.  This includes reviewing the patient's records speaking with him in the palliative care people to determine exactly what they want done.  Then also speaking with the hospitalist.  Procedures   ____________________________________________   INITIAL IMPRESSION / ASSESSMENT AND PLAN / ED COURSE  Paul Schmitt was evaluated in Emergency Department on 12/26/2018 for the symptoms described in the history of present illness. He was evaluated in the context of the global COVID-19 pandemic, which necessitated consideration that the patient might be at risk for infection with the SARS-CoV-2 virus that causes COVID-19. Institutional protocols and algorithms that pertain to the evaluation of patients at risk for COVID-19 are in a state of rapid change based on information released by regulatory bodies including the CDC and federal and state organizations. These policies and algorithms were followed during the patient's care in  the ED.    Patient with severe anemia comfort care only for now at least.  P.o. I will be talking to the rest of his family.  We will get him in the hospital and begin  transfusion.  Blood pressures come up somewhat with just fluids.          ____________________________________________   FINAL CLINICAL IMPRESSION(S) / ED DIAGNOSES  Final diagnoses:  Seizure ([place])  Symptomatic anemia     ED Discharge Orders    None       Note:  This document was prepared using Dragon voice recognition software and may include unintentional dictation errors.    Nena Polio, MD 12/26/18 1742    Nena Polio, MD 01/03/19 1409

## 2018-12-26 NOTE — ED Notes (Signed)
Attempted to call report at this time; asked if this RN could give inpatient unit 5 more minutes and call back.

## 2018-12-26 NOTE — ED Notes (Signed)
Spoke with blood bank to inform duplicate orders were placed and being cancelled.  She informed this RN blood would be ready at 1900.

## 2018-12-26 NOTE — H&P (Signed)
[place] at [place] NAME: Paul Schmitt    MR#:  [id number]  DATE OF BIRTH:  Oct 02, 1922  DATE OF ADMISSION:  12/26/2018  PRIMARY CARE PHYSICIAN: Benson Setting, MD   REQUESTING/REFERRING PHYSICIAN: Melinda  CHIEF COMPLAINT:  Shortness of breath?  Seizures  HISTORY OF PRESENT ILLNESS:  Paul Schmitt  is a 85 y.o. male with a known history of lung cancer and total whiteout of the lung from 12/03/2018, history of seizures on Keppra, DNR hospice care at home is brought into the ED after he had seizure-like activity according to the patient's daughter at bedside patient was actually shaking she does not believe that he has real seizures and not requesting any further investigations for seizures, hemoglobin is at 4.7, patient and his healthcare power of attorney are agreeable for blood transfusion but no other investigations.  Hospice care RN Santiago Glad is involved.  Patient is resting comfortably during my examination  PAST MEDICAL HISTORY:   Past Medical History:  Diagnosis Date  . Asthma   . Carotid arterial disease ([place])    a. 01/2015 CT neck: incidental finding of severe RICA stenosis;  b. 01/2015 Carotid U/S: RICA 32-99%, LICA 24-26%.  . Diastolic dysfunction    a. echo 08/2014: EF 60-65%, nl wall motion, GR1DD, mildly dilated LA, nl PASP  . Essential hypertension   . Normal coronary arteries    a. cardiac cath 1989: normal coronary arteries; b. 09/2014 nuclear stress test: no significant ischemia or scar, EF 49%.  . Palpitations    a. 09/2014 Event Monitor: sinus rhythm, rare pvc's, no notable arrhythmia.  . Parotid mass    a. 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  . Prostate cancer ([place])   . RBBB   . Seizures ([place])   . Syncope   . Vertigo     PAST SURGICAL HISTOIRY:   Past Surgical History:  Procedure Laterality Date  . HERNIA REPAIR      SOCIAL HISTORY:   Social History   Tobacco Use  .  Smoking status: Former Smoker    Packs/day: 0.25    Years: 70.00    Pack years: 17.50    Types: Cigarettes    Quit date: 05/29/2014    Years since quitting: 4.5  . Smokeless tobacco: Never Used  . Tobacco comment: stop smoking about 13mths ago  Substance Use Topics  . Alcohol use: No    FAMILY HISTORY:   Family History  Problem Relation Age of Onset  . Heart disease Mother   . Heart attack Mother 69  . Hyperlipidemia Mother   . Hypertension Mother   . Heart disease Sister   . Heart attack Sister 31  . Heart disease Brother   . Heart attack Brother 34    DRUG ALLERGIES:  No Known Allergies  REVIEW OF SYSTEMS:  CONSTITUTIONAL: No fever, reports fatigue or weakness.  Some shaking tremors EYES: No blurred or double vision.  EARS, NOSE, AND THROAT: No tinnitus or ear pain.  RESPIRATORY: No cough, has exertionalshortness of breath, denies wheezing or hemoptysis.  CARDIOVASCULAR: No chest pain, orthopnea, edema.  GASTROINTESTINAL: No nausea, vomiting, diarrhea or abdominal pain.  SKIN: No rash or lesion. MUSCULOSKELETAL: No joint pain or arthritis.   NEUROLOGIC: No tingling, numbness, weakness.     MEDICATIONS AT HOME:   Prior to Admission medications   Medication Sig Start Date End Date Taking? Authorizing Provider  albuterol (PROVENTIL  HFA;VENTOLIN HFA) 108 (90 Base) MCG/ACT inhaler Inhale 2 puffs into the lungs every 6 (six) hours as needed for wheezing or shortness of breath.    [provider]  albuterol (PROVENTIL) (2.5 MG/3ML) 0.083% nebulizer solution Take 2.5 mg by nebulization every 6 (six) hours as needed for wheezing or shortness of breath.  08/15/18   [provider]  amLODipine (NORVASC) 10 MG tablet Take 10 mg by mouth every morning.     [provider]  aspirin EC 81 MG tablet Take 1 tablet (81 mg total) by mouth daily. 03/26/15   Theora Gianotti, NP  dronabinol (MARINOL) 2.5 MG capsule Take 2.5 mg by mouth 2 (two) times  daily before a meal.    [provider]  furosemide (LASIX) 20 MG tablet Take 20 mg by mouth every morning.     [provider]  levETIRAcetam (KEPPRA) 500 MG tablet Take 1 tablet (500 mg total) by mouth 2 (two) times daily. 12/08/18   Fritzi Mandes, MD  levothyroxine (SYNTHROID, LEVOTHROID) 50 MCG tablet Take 50 mcg by mouth daily before breakfast.    [provider]  loratadine (CLARITIN) 10 MG tablet Take 10 mg by mouth every morning.     [provider]  traZODone (DESYREL) 100 MG tablet Take 100 mg by mouth at bedtime.  04/05/18   [provider]      VITAL SIGNS:  Blood pressure 112/79, pulse (!) 106, temperature (!) 96.8 F (36 C), temperature source Axillary, resp. rate 18, height 6' (1.829 m), weight 63.5 kg, SpO2 95 %.  PHYSICAL EXAMINATION:  GENERAL:  85 y.o.-year-old patient lying in the bed with no acute distress.  EYES: Pupils equal, round, reactive to light and accommodation. No scleral icterus. Extraocular muscles intact.  HEENT: Head atraumatic, normocephalic. Oropharynx and nasopharynx clear.  NECK:  Supple, no jugular venous distention. No thyroid enlargement, no tenderness.  LUNGS: Diminished breath sounds bilaterally, no wheezing, rales,rhonchi or crepitation. No use of accessory muscles of respiration.  CARDIOVASCULAR: S1, S2 normal. No murmurs, rubs, or gallops.  ABDOMEN: Soft, nontender, nondistended. Bowel sounds present. EXTREMITIES: No pedal edema, cyanosis, or clubbing.  NEUROLOGIC: AWAKE AND ALERT AND ORIENTED X3  PSYCHIATRIC: The patient is alert and oriented x 3.  SKIN: No obvious rash, lesion, or ulcer.   LABORATORY PANEL:   CBC Recent Labs  Lab 12/26/18 1649  WBC 3.7*  HGB 4.7*  HCT 15.9*  PLT 203   ------------------------------------------------------------------------------------------------------------------  Chemistries  Recent Labs  Lab 12/26/18 1649  NA 136  K 3.7  CL 101  CO2 24  GLUCOSE  107*  BUN 12  CREATININE 0.82  CALCIUM 8.5*  AST 25  ALT 11  ALKPHOS 63  BILITOT 0.7   ------------------------------------------------------------------------------------------------------------------  Cardiac Enzymes No results for input(s): TROPONINI in the last 168 hours. ------------------------------------------------------------------------------------------------------------------  RADIOLOGY:  Dg Chest Portable 1 View  Result Date: 12/26/2018 CLINICAL DATA:  Weakness EXAM: PORTABLE CHEST 1 VIEW COMPARISON:  12/03/2018 chest radiograph. FINDINGS: Stable complete opacification of the left hemithorax. Trachea slightly left of midline. Stable normal right mediastinal contour. Unable to accurately assess heart size. No right pneumothorax. No right pleural effusion. Clear right lung. IMPRESSION: Complete opacification of the left hemithorax, unchanged since 12/03/2018 chest radiograph, suspected to be due to obstructing central left lung mass on 12/03/2018 chest CT angiogram study. Clear right lung. Electronically Signed   By: Ilona Sorrel M.D.   On: 12/26/2018 17:12    EKG:   Orders placed or  performed during the hospital encounter of 12/26/18  . ED EKG  . ED EKG    IMPRESSION AND PLAN:   #Symptomatic anemia Admit to MedSurg unit 2 units of blood transfusion No further investigations needed as per patient and family members  #History of lung cancer Patient is hospice at home, will continue the same DNR with comfort care measures were requested  #Hypothyroidism continue home medication Synthyroid  #Insomnia continue trazodone  #History of seizures continue home medication Keppra  Extremely poor prognosis and patient is getting hospice care at home Patient and his healthcare power of attorney [facility] requesting DNR with comfort care measures and plan is to discharge patient after blood transfusion with hospice care at home   All the records are reviewed and case  discussed with ED provider. Management plans discussed with the patient, family and they are in agreement.  CODE STATUS: dnr , comfort care sister-in-law mauriel  is the healthcare power of attorney  TOTAL TIME TAKING CARE OF THIS PATIENT: 43 minutes.   Note: This dictation was prepared with Dragon dictation along with smaller phrase technology. Any transcriptional errors that result from this process are unintentional.  Nicholes Mango M.D on 12/26/2018 at 6:56 PM  Between 7am to 6pm - Pager - [phone]  After 6pm go to [email or web address] - password [email or web address] [place] [facility]  Office  [phone]  CC: Primary care physician; Benson Setting, MD

## 2018-12-26 NOTE — Progress Notes (Signed)
Family Meeting Note  Advance Directive:yes  Today a meeting took place with the Patient , daughter at bed side   The following clinical team members were present during this meeting:MD  The following were discussed:Patient's diagnosis: Symptomatic anemia, chronic lung cancer on hospice care at home, hypothyroidism, insomnia, seizures, failure to thrive will be admitted to the hospital for blood transfusion.  Patient and his healthcare power of attorney are refusing any aggressive measures.  Requesting comfort care measures and they want to take him home with hospice care after blood transfusion tomorrow   patient's progosis: < 12 months and Goals for treatment: DNR  Comfort care measures and disposition with hospice care at home Healthcare power of attorney sister-in-law Mauriel  Additional follow-up to be provided: Hospitalist  Time spent during discussion:17 min  Nicholes Mango, MD

## 2018-12-26 NOTE — ED Notes (Addendum)
Spoke with [facility], currently she would like patient to receive blood to help him symptomatically but would not like procedures done to look for source of bleeding as they would not plan to fix bleed with invasive procedure because of patient's age/medical hx.

## 2018-12-27 LAB — TYPE AND SCREEN
ABO/RH(D): A NEG
Antibody Screen: NEGATIVE
Unit division: 0
Unit division: 0

## 2018-12-27 LAB — BPAM RBC
Blood Product Expiration Date: 202010092359
Blood Product Expiration Date: 202010242359
ISSUE DATE / TIME: 202009301911
ISSUE DATE / TIME: 202009302209
Unit Type and Rh: 600
Unit Type and Rh: 600

## 2018-12-27 LAB — SARS CORONAVIRUS 2 (TAT 6-24 HRS): SARS Coronavirus 2: NEGATIVE

## 2018-12-27 LAB — HEMOGLOBIN AND HEMATOCRIT, BLOOD
HCT: 34.4 % — ABNORMAL LOW (ref 39.0–52.0)
Hemoglobin: 11.1 g/dL — ABNORMAL LOW (ref 13.0–17.0)

## 2018-12-27 MED ORDER — SODIUM CHLORIDE 0.9% FLUSH
3.0000 mL | Freq: Two times a day (BID) | INTRAVENOUS | Status: DC
  Administered 2018-12-27: 08:00:00 3 mL via INTRAVENOUS

## 2018-12-27 MED ORDER — SODIUM CHLORIDE 0.9% FLUSH
3.0000 mL | INTRAVENOUS | Status: DC | PRN
  Administered 2018-12-27: 3 mL via INTRAVENOUS
  Filled 2018-12-27: qty 3

## 2018-12-27 NOTE — Progress Notes (Signed)
HR at around 0715 was in the 140 - 150's.  Patient states it is frequently high. Without treatment it dropped to 120's within 15 minutes.  Is now < 110.

## 2018-12-27 NOTE — TOC Transition Note (Addendum)
Transition of Care [facility]) - CM/SW Discharge Note   Patient Details  Name: Paul Schmitt MRN: [id number] Date of Birth: 1922/05/01  [facility] CM/SW Contact:  Ross Ludwig, LCSW Phone Number: 12/27/2018, 1:40 PM   Clinical Narrative:    Patient is a 84 year old hospice services.  Patient lives with his family and is with [facility] hospice services.             Patient is currently being followed by [facility] services in the home.  Patient is currently staying with his family who help take care of patient.  Patient was admitted for a blood transfusion.  Patient and family did not express any other issues or concerns.        Patient Goals and CMS Choice  To return back home and continue with hospice services.      Discharge Placement  Home with Hospice services.                     Discharge Plan and Services  Plan to return back home with hospice services.                                                                                                                                                                        Social Determinants of Health (SDOH) Interventions     Readmission Risk Interventions No flowsheet data found.

## 2018-12-27 NOTE — Progress Notes (Signed)
Visit made. Patient seen sitting up in bed, breakfast tray at bedside, patient declined anything from the tray, he wanted to wait until he got home as he did not have his dentures, he did request to keep his juice. Chart reviewed, Hb per lab report this morning was up to 11.1 after receiving 2 units. Patient more alert and interactive this morning. TV turned on and water placed on bedside table per patient request. Plan is for patient to return home today via EMS. Out of facility DNR is in place in the hospital chart. Will continue to follow through discharge and update hospice team. Flo Shanks BSN, RN, [facility] Liaison [facility] hospice [phone]

## 2018-12-27 NOTE — Progress Notes (Signed)
Discharge to home via EMS.  Daughte aware he's on the way.

## 2018-12-27 NOTE — Discharge Instructions (Signed)
Continue hospice services at home/

## 2018-12-27 NOTE — Care Management Important Message (Signed)
Important Message  Patient Details  Name: Paul Schmitt MRN: [id number] Date of Birth: Dec 22, 1922   Medicare Important Message Given:  Yes  Initial Medicare IM given by Patient Access Associate on 12/26/2018 at 8:52pm.    Dannette Barbara 12/27/2018, 2:09 PM

## 2018-12-27 NOTE — Progress Notes (Signed)
Phone call received from [facility] requesting copy of Medical necessity transportation form due to previously given form left at patient's home on arrival.  Copy faxed to [phone].

## 2018-12-27 NOTE — Discharge Summary (Signed)
[place] at [place] NAME: Paul Schmitt    MR#:  [id number]  DATE OF BIRTH:  May 24, 1922  DATE OF ADMISSION:  12/26/2018 ADMITTING PHYSICIAN: Nicholes Mango, MD  DATE OF DISCHARGE: 12/27/2018   PRIMARY CARE PHYSICIAN: Cene', Crystal, MD    ADMISSION DIAGNOSIS:  Seizure ([place]) [R56.9] Symptomatic anemia [D64.9]  DISCHARGE DIAGNOSIS:  Active Problems:   Symptomatic anemia   SECONDARY DIAGNOSIS:   Past Medical History:  Diagnosis Date  . Asthma   . Carotid arterial disease ([place])    a. 01/2015 CT neck: incidental finding of severe RICA stenosis;  b. 01/2015 Carotid U/S: RICA 74-25%, LICA 95-63%.  . Diastolic dysfunction    a. echo 08/2014: EF 60-65%, nl wall motion, GR1DD, mildly dilated LA, nl PASP  . Essential hypertension   . Normal coronary arteries    a. cardiac cath 1989: normal coronary arteries; b. 09/2014 nuclear stress test: no significant ischemia or scar, EF 49%.  . Palpitations    a. 09/2014 Event Monitor: sinus rhythm, rare pvc's, no notable arrhythmia.  . Parotid mass    a. 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  . Prostate cancer ([place])   . RBBB   . Seizures ([place])   . Syncope   . Vertigo     HOSPITAL COURSE:   #Symptomatic anemia Admitted to MedSurg unit 2 units of blood transfusion No further investigations needed as per patient and family members  #History of lung cancer Patient is hospice at home, will continue the same DNR with comfort care measures were requested  #Hypothyroidism continue home medication Synthyroid  #Insomnia continue trazodone  #History of seizures continue home medication Keppra  Extremely poor prognosis and patient is getting hospice care at home Patient and his healthcare power of attorney Mauriel requesting DNR with comfort care measures and to discharge patient after blood transfusion with hospice care at home  DISCHARGE CONDITIONS:    Stable.  CONSULTS OBTAINED:    DRUG ALLERGIES:  No Known Allergies  DISCHARGE MEDICATIONS:   Allergies as of 12/27/2018   No Known Allergies     Medication List    STOP taking these medications   aspirin EC 81 MG tablet     TAKE these medications   albuterol 108 (90 Base) MCG/ACT inhaler Commonly known as: VENTOLIN HFA Inhale 2 puffs into the lungs every 6 (six) hours as needed for wheezing or shortness of breath.   albuterol (2.5 MG/3ML) 0.083% nebulizer solution Commonly known as: PROVENTIL Take 2.5 mg by nebulization every 6 (six) hours as needed for wheezing or shortness of breath.   amLODipine 10 MG tablet Commonly known as: NORVASC Take 10 mg by mouth every morning.   dronabinol 2.5 MG capsule Commonly known as: MARINOL Take 2.5 mg by mouth 2 (two) times daily before a meal.   furosemide 20 MG tablet Commonly known as: LASIX Take 20 mg by mouth every morning.   levETIRAcetam 500 MG tablet Commonly known as: KEPPRA Take 1 tablet (500 mg total) by mouth 2 (two) times daily.   levothyroxine 50 MCG tablet Commonly known as: SYNTHROID Take 50 mcg by mouth daily before breakfast.   loratadine 10 MG tablet Commonly known as: CLARITIN Take 10 mg by mouth every morning.   traZODone 100 MG tablet Commonly known as: DESYREL Take 100 mg by mouth at bedtime.        DISCHARGE INSTRUCTIONS:    Follow  with hospice at home.  If you experience worsening of your admission symptoms, develop shortness of breath, life threatening emergency, suicidal or homicidal thoughts you must seek medical attention immediately by calling 911 or calling your MD immediately  if symptoms less severe.  You Must read complete instructions/literature along with all the possible adverse reactions/side effects for all the Medicines you take and that have been prescribed to you. Take any new Medicines after you have completely understood and accept all the possible adverse  reactions/side effects.   Please note  You were cared for by a hospitalist during your hospital stay. If you have any questions about your discharge medications or the care you received while you were in the hospital after you are discharged, you can call the unit and asked to speak with the hospitalist on call if the hospitalist that took care of you is not available. Once you are discharged, your primary care physician will handle any further medical issues. Please note that NO REFILLS for any discharge medications will be authorized once you are discharged, as it is imperative that you return to your primary care physician (or establish a relationship with a primary care physician if you do not have one) for your aftercare needs so that they can reassess your need for medications and monitor your lab values.    Today   CHIEF COMPLAINT:   Chief Complaint  Patient presents with  . Seizures    HISTORY OF PRESENT ILLNESS:  Paul Schmitt  is a 85 y.o. male with a known history of lung cancer and total whiteout of the lung from 12/03/2018, history of seizures on Keppra, DNR hospice care at home is brought into the ED after he had seizure-like activity according to the patient's daughter at bedside patient was actually shaking she does not believe that he has real seizures and not requesting any further investigations for seizures, hemoglobin is at 4.7, patient and his healthcare power of attorney are agreeable for blood transfusion but no other investigations.  Hospice care RN Santiago Glad is involved.  Patient is resting comfortably during my examination   VITAL SIGNS:  Blood pressure 115/80, pulse (!) 109, temperature (!) 97.4 F (36.3 C), resp. rate 15, height 6' (1.829 m), weight 63.5 kg, SpO2 100 %.  I/O:    Intake/Output Summary (Last 24 hours) at 12/27/2018 1321 Last data filed at 12/27/2018 1200 Gross per 24 hour  Intake 2009.75 ml  Output 400 ml  Net 1609.75 ml    PHYSICAL EXAMINATION:    GENERAL:  85 y.o.-year-old patient lying in the bed with no acute distress.  EYES: Pupils equal, round, reactive to light and accommodation. No scleral icterus. Extraocular muscles intact.  HEENT: Head atraumatic, normocephalic. Oropharynx and nasopharynx clear.  NECK:  Supple, no jugular venous distention. No thyroid enlargement, no tenderness.  LUNGS: Diminished breath sounds bilaterally, no wheezing, rales,rhonchi or crepitation. No use of accessory muscles of respiration.  CARDIOVASCULAR: S1, S2 normal. No murmurs, rubs, or gallops.  ABDOMEN: Soft, nontender, nondistended. Bowel sounds present. EXTREMITIES: No pedal edema, cyanosis, or clubbing.  NEUROLOGIC: AWAKE AND ALERT AND ORIENTED X3  PSYCHIATRIC: The patient is alert and oriented x 3.  SKIN: No obvious rash, lesion, or ulcer.   DATA REVIEW:   CBC Recent Labs  Lab 12/26/18 1649 12/27/18 0256  WBC 3.7*  --   HGB 4.7* 11.1*  HCT 15.9* 34.4*  PLT 203  --     Chemistries  Recent Labs  Lab 12/26/18 1649  NA 136  K 3.7  CL 101  CO2 24  GLUCOSE 107*  BUN 12  CREATININE 0.82  CALCIUM 8.5*  AST 25  ALT 11  ALKPHOS 63  BILITOT 0.7    Cardiac Enzymes No results for input(s): TROPONINI in the last 168 hours.  Microbiology Results  Results for orders placed or performed during the hospital encounter of 12/26/18  SARS CORONAVIRUS 2 (TAT 6-24 HRS) Nasopharyngeal Nasopharyngeal Swab     Status: None   Collection Time: 12/26/18  7:27 PM   Specimen: Nasopharyngeal Swab  Result Value Ref Range Status   SARS Coronavirus 2 NEGATIVE NEGATIVE Final    Comment: (NOTE) SARS-CoV-2 target nucleic acids are NOT DETECTED. The SARS-CoV-2 RNA is generally detectable in upper and lower respiratory specimens during the acute phase of infection. Negative results do not preclude SARS-CoV-2 infection, do not rule out co-infections with other pathogens, and should not be used as the sole basis for treatment or other patient  management decisions. Negative results must be combined with clinical observations, patient history, and epidemiological information. The expected result is Negative. Fact Sheet for Patients: SugarRoll.be Fact Sheet for Healthcare Providers: https://www.woods-mathews.com/ This test is not yet approved or cleared by the [place] FDA and  has been authorized for detection and/or diagnosis of SARS-CoV-2 by FDA under an Emergency Use Authorization (EUA). This EUA will remain  in effect (meaning this test can be used) for the duration of the COVID-19 declaration under Section 56 4(b)(1) of the Act, 21 U.S.C. section 360bbb-3(b)(1), unless the authorization is terminated or revoked sooner. Performed at [facility], [place] [address], [place], [place] [address]     RADIOLOGY:  Dg Chest Portable 1 View  Result Date: 12/26/2018 CLINICAL DATA:  Weakness EXAM: PORTABLE CHEST 1 VIEW COMPARISON:  12/03/2018 chest radiograph. FINDINGS: Stable complete opacification of the left hemithorax. Trachea slightly left of midline. Stable normal right mediastinal contour. Unable to accurately assess heart size. No right pneumothorax. No right pleural effusion. Clear right lung. IMPRESSION: Complete opacification of the left hemithorax, unchanged since 12/03/2018 chest radiograph, suspected to be due to obstructing central left lung mass on 12/03/2018 chest CT angiogram study. Clear right lung. Electronically Signed   By: Ilona Sorrel M.D.   On: 12/26/2018 17:12    EKG:   Orders placed or performed during the hospital encounter of 12/26/18  . ED EKG  . ED EKG      Management plans discussed with the patient, family and they are in agreement.  CODE STATUS:     Code Status Orders  (From admission, onward)         Start     Ordered   12/26/18 2055  Do not attempt resuscitation (DNR)  Continuous    Question Answer Comment  In the event of cardiac  or respiratory ARREST Do not call a "code blue"   In the event of cardiac or respiratory ARREST Do not perform Intubation, CPR, defibrillation or ACLS   In the event of cardiac or respiratory ARREST Use medication by any route, position, wound care, and other measures to relive pain and suffering. May use oxygen, suction and manual treatment of airway obstruction as needed for comfort.      12/26/18 2054        Code Status History    Date Active Date Inactive Code Status Order ID Comments User Context   12/06/2018 2219 12/08/2018 1536 DNR [id number]  Henreitta Leber, MD Inpatient  12/03/2018 1818 12/05/2018 1730 DNR [id number]  Asencion Noble, MD ED   Advance Care Planning Activity    Advance Directive Documentation     Most Recent Value  Type of Advance Directive  Healthcare Power of [place], Living will  Pre-existing out of facility DNR order (yellow form or pink MOST form)  -  "MOST" Form in Place?  -      TOTAL TIME TAKING CARE OF THIS PATIENT: 35 minutes.    Vaughan Basta M.D on 12/27/2018 at 1:21 PM  Between 7am to 6pm - Pager - [phone]  After 6pm go to [email or web address] - password [email or web address] [place] [facility]  Office  [phone]  CC: Primary care physician; Benson Setting, MD   Note: This dictation was prepared with Dragon dictation along with smaller phrase technology. Any transcriptional errors that result from this process are unintentional.

## 2019-03-29 DEATH — deceased
